# Patient Record
Sex: Female | Born: 1990 | Race: White | Hispanic: No | Marital: Single | Smoking: Never smoker
Health system: Southern US, Community
[De-identification: ages and names within clinical notes are randomized; demographics above are authoritative.]

## PROBLEM LIST (undated history)

## (undated) DIAGNOSIS — A071 Giardiasis [lambliasis]: Secondary | ICD-10-CM

## (undated) DIAGNOSIS — N920 Excessive and frequent menstruation with regular cycle: Secondary | ICD-10-CM

## (undated) DIAGNOSIS — Z9109 Other allergy status, other than to drugs and biological substances: Secondary | ICD-10-CM

## (undated) DIAGNOSIS — F419 Anxiety disorder, unspecified: Secondary | ICD-10-CM

## (undated) DIAGNOSIS — R569 Unspecified convulsions: Secondary | ICD-10-CM

## (undated) DIAGNOSIS — F32A Depression, unspecified: Secondary | ICD-10-CM

## (undated) DIAGNOSIS — F329 Major depressive disorder, single episode, unspecified: Secondary | ICD-10-CM

## (undated) HISTORY — DX: Other allergy status, other than to drugs and biological substances: Z91.09

## (undated) HISTORY — DX: Anxiety disorder, unspecified: F41.9

## (undated) HISTORY — DX: Major depressive disorder, single episode, unspecified: F32.9

## (undated) HISTORY — DX: Excessive and frequent menstruation with regular cycle: N92.0

## (undated) HISTORY — DX: Depression, unspecified: F32.A

## (undated) HISTORY — PX: FOOT SURGERY: SHX648

## (undated) HISTORY — DX: Hemochromatosis, unspecified: E83.119

## (undated) HISTORY — PX: COSMETIC SURGERY: SHX468

---
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
## 2012-07-09 ENCOUNTER — Ambulatory Visit
Admission: RE | Admit: 2012-07-09 | Discharge: 2012-07-09 | Disposition: A | Discharge status: Home / Self Care | Payer: 59 | Source: Ambulatory Visit

## 2012-07-09 ENCOUNTER — Other Ambulatory Visit: Payer: Self-pay

## 2012-07-09 DIAGNOSIS — R51 Headache: Secondary | ICD-10-CM

## 2013-05-01 ENCOUNTER — Other Ambulatory Visit: Payer: Self-pay | Admitting: Podiatry

## 2013-05-01 DIAGNOSIS — M76829 Posterior tibial tendinitis, unspecified leg: Secondary | ICD-10-CM

## 2013-05-06 ENCOUNTER — Ambulatory Visit
Admission: RE | Admit: 2013-05-06 | Discharge: 2013-05-06 | Disposition: A | Discharge status: Home / Self Care | Payer: 59 | Source: Ambulatory Visit | Attending: Podiatry | Admitting: Podiatry

## 2013-05-06 DIAGNOSIS — M76829 Posterior tibial tendinitis, unspecified leg: Secondary | ICD-10-CM

## 2013-05-21 ENCOUNTER — Encounter: Payer: Self-pay | Admitting: Podiatry

## 2013-05-21 ENCOUNTER — Ambulatory Visit (INDEPENDENT_AMBULATORY_CARE_PROVIDER_SITE_OTHER): Payer: 59 | Admitting: Podiatry

## 2013-05-21 VITALS — BP 111/79 | HR 60 | Resp 12 | Ht 71.0 in | Wt 180.0 lb

## 2013-05-21 DIAGNOSIS — M7750 Other enthesopathy of unspecified foot: Secondary | ICD-10-CM

## 2013-05-21 MED ORDER — TRIAMCINOLONE ACETONIDE 10 MG/ML IJ SUSP
5.0000 mg | Freq: Once | INTRAMUSCULAR | Status: AC
  Administered 2013-05-21: 5 mg via INTRA_ARTICULAR

## 2013-05-21 NOTE — Progress Notes (Signed)
Subjective:     Patient ID: Samantha [name], female   DOB: 09-11-1990, 22 y.o.   MRN: [id number]  Foot Pain   patient's left foot is still sore but gradually slightly better. She is with her mother today   Review of Systems  All other systems reviewed and are negative.       Objective:   Physical Exam  Nursing note and vitals reviewed. Constitutional: She appears well-developed and well-nourished.  Cardiovascular: Intact distal pulses.   Musculoskeletal: Normal range of motion.  Neurological: She is alert.  Skin: Skin is warm.   Patient's left arch and slightly proximal remains tender to palpation. The pain seems to be more distal than it where it was previous    Assessment:     Tendinitis with plantar fasciitis still noted    Plan:     Reviewed MRI with patient and mother. Explained that there was no indications of care or active process we reviewed everything we have tried up to this time and I did inject more distal 3 mg Kenalog 5 g Xylocaine Marcaine mixture and advised on continued immobilization. Reappoint her recheck as needed

## 2013-06-18 ENCOUNTER — Ambulatory Visit (INDEPENDENT_AMBULATORY_CARE_PROVIDER_SITE_OTHER): Payer: 59 | Admitting: Podiatry

## 2013-06-18 ENCOUNTER — Encounter: Payer: Self-pay | Admitting: Podiatry

## 2013-06-18 VITALS — BP 138/68 | HR 76 | Resp 12

## 2013-06-18 DIAGNOSIS — M7750 Other enthesopathy of unspecified foot: Secondary | ICD-10-CM

## 2013-06-18 NOTE — Progress Notes (Signed)
Subjective:     Patient ID: Samantha [name], female   DOB: 1991-05-17, 22 y.o.   MRN: [id number]  Foot Pain   patient states that it's feeling some better and only seems to hurt after a long day of work. Points to left arch   Review of Systems     Objective:   Physical Exam  Nursing note and vitals reviewed. Constitutional: She is oriented to person, place, and time.  Cardiovascular: Intact distal pulses.   Musculoskeletal: Normal range of motion.  Neurological: She is oriented to person, place, and time.  Skin: Skin is warm.   patient has mild discomfort when I pressed into the area but improved over where it has been previously with minimal edema noted     Assessment:     Improving tendinitis left    Plan:     Continue conservative orthotic usage boot usage at times and progressive ice. Patient discharged un less need

## 2015-02-07 ENCOUNTER — Encounter (HOSPITAL_COMMUNITY): Payer: Self-pay | Admitting: Emergency Medicine

## 2015-02-07 ENCOUNTER — Emergency Department (HOSPITAL_COMMUNITY)
Admission: EM | Admit: 2015-02-07 | Discharge: 2015-02-07 | Disposition: A | Discharge status: Home / Self Care | Payer: 59 | Attending: Emergency Medicine | Admitting: Emergency Medicine

## 2015-02-07 DIAGNOSIS — R197 Diarrhea, unspecified: Secondary | ICD-10-CM | POA: Diagnosis not present

## 2015-02-07 DIAGNOSIS — Z79899 Other long term (current) drug therapy: Secondary | ICD-10-CM | POA: Insufficient documentation

## 2015-02-07 DIAGNOSIS — Z793 Long term (current) use of hormonal contraceptives: Secondary | ICD-10-CM | POA: Diagnosis not present

## 2015-02-07 DIAGNOSIS — R112 Nausea with vomiting, unspecified: Secondary | ICD-10-CM | POA: Insufficient documentation

## 2015-02-07 DIAGNOSIS — Z3202 Encounter for pregnancy test, result negative: Secondary | ICD-10-CM | POA: Insufficient documentation

## 2015-02-07 DIAGNOSIS — Z8619 Personal history of other infectious and parasitic diseases: Secondary | ICD-10-CM | POA: Diagnosis not present

## 2015-02-07 HISTORY — DX: Giardiasis (lambliasis): A07.1

## 2015-02-07 LAB — I-STAT CHEM 8, ED
BUN: 8 mg/dL (ref 6–20)
CALCIUM ION: 1.16 mmol/L (ref 1.12–1.23)
CHLORIDE: 106 mmol/L (ref 101–111)
Creatinine, Ser: 1.1 mg/dL — ABNORMAL HIGH (ref 0.44–1.00)
GLUCOSE: 94 mg/dL (ref 65–99)
HCT: 46 % (ref 36.0–46.0)
HEMOGLOBIN: 15.6 g/dL — AB (ref 12.0–15.0)
POTASSIUM: 3.9 mmol/L (ref 3.5–5.1)
Sodium: 139 mmol/L (ref 135–145)
TCO2: 20 mmol/L (ref 0–100)

## 2015-02-07 LAB — URINALYSIS, ROUTINE W REFLEX MICROSCOPIC
Bilirubin Urine: NEGATIVE
Glucose, UA: NEGATIVE mg/dL
HGB URINE DIPSTICK: NEGATIVE
KETONES UR: NEGATIVE mg/dL
LEUKOCYTES UA: NEGATIVE
Nitrite: NEGATIVE
PH: 6.5 (ref 5.0–8.0)
PROTEIN: NEGATIVE mg/dL
SPECIFIC GRAVITY, URINE: 1.008 (ref 1.005–1.030)
Urobilinogen, UA: 0.2 mg/dL (ref 0.0–1.0)

## 2015-02-07 LAB — PREGNANCY, URINE: Preg Test, Ur: NEGATIVE

## 2015-02-07 MED ORDER — SODIUM CHLORIDE 0.9 % IV BOLUS (SEPSIS)
1000.0000 mL | Freq: Once | INTRAVENOUS | Status: AC
Start: 2015-02-07 — End: 2015-02-07
  Administered 2015-02-07: 1000 mL via INTRAVENOUS

## 2015-02-07 MED ORDER — ONDANSETRON HCL 4 MG/2ML IJ SOLN
4.0000 mg | Freq: Once | INTRAMUSCULAR | Status: AC
  Administered 2015-02-07: 4 mg via INTRAVENOUS
  Filled 2015-02-07: qty 2

## 2015-02-07 MED ORDER — ONDANSETRON HCL 4 MG PO TABS: 4.0000 mg | ORAL_TABLET | Freq: Four times a day (QID) | ORAL | Status: AC

## 2015-02-07 NOTE — ED Notes (Signed)
Bed: WA02 Expected date:  Expected time:  Means of arrival:  Comments: 

## 2015-02-07 NOTE — ED Notes (Signed)
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 Pt states she feels the same as she did previously. Pt states she is able to keep down water, but feels bloated and has epigastric discomfort.

## 2015-02-07 NOTE — ED Notes (Signed)
Pt c/o n/v/d for several days, last 24hours 4 x and diarrhea several times. Pt states she was diagnosed with Giardia a few weeks ago and was treated with antibiotics, pt states s/s similar.

## 2015-02-07 NOTE — ED Provider Notes (Signed)
CSN: [id number]     Arrival date & time 02/07/15  1943 History   First MD Initiated Contact with Patient 02/07/15 2006     Chief Complaint  Patient presents with  . Emesis     (Consider location/radiation/quality/duration/timing/severity/associated sxs/prior Treatment) HPI  Pt presenting with c/o vomiting and diarrhea.  Symptoms began yesterday, she has had about 3 episodes of emesis today.  Nonbloody and nonbilious.  One episode of watery diarrhea today, more yesterday.  She states the diarrhea is foul smelling and she states her symptoms are the same as giardia- she was treated for that in April 2016.  No recent travel, no camping.  No other contacts with similar symptoms.  There are no other associated systemic symptoms, there are no other alleviating or modifying factors.   Past Medical History  Diagnosis Date  . Giardia    Past Surgical History  Procedure Laterality Date  . Foot surgery Left    No family history on file. History  Substance Use Topics  . Smoking status: Never Smoker   . Smokeless tobacco: Not on file  . Alcohol Use: Yes     Comment: daily   OB History    No data available     Review of Systems  ROS reviewed and all otherwise negative except for mentioned in HPI    Allergies  Sulfa antibiotics  Home Medications   Prior to Admission medications   Medication Sig Start Date End Date Taking? Authorizing Provider  FLUoxetine (PROZAC) 40 MG capsule Take 1 capsule by mouth daily. 02/05/15  Yes Historical Provider, MD  levonorgestrel-ethinyl estradiol (SEASONALE,INTROVALE,JOLESSA) 0.15-0.03 MG tablet Take 1 tablet by mouth daily. 12/26/14  Yes Historical Provider, MD  ondansetron (ZOFRAN) 4 MG tablet Take 1 tablet (4 mg total) by mouth every 6 (six) hours. 02/07/15   Jerelyn Scott, MD   BP 140/85 mmHg  Pulse 84  Temp(Src) 98 F (36.7 C) (Oral)  Resp 18  Ht  (1.803 m)  Wt 209 lb 9.6 oz (95.074 kg)  BMI 29.25 kg/m2  SpO2 100%  LMP 01/17/2015   Vitals reviewed Physical Exam  Physical Examination: General appearance - alert, well appearing, and in no distress Mental status - alert, oriented to person, place, and time Eyes - no conjunctival injection, no scleral icterus Mouth - mucous membranes moist, pharynx normal without lesions Chest - clear to auscultation, no wheezes, rales or rhonchi, symmetric air entry Heart - normal rate, regular rhythm, normal S1, S2, no murmurs, rubs, clicks or gallops Abdomen - soft, nontender, nondistended, no masses or organomegaly, nabs Neurological - alert, oriented Extremities - peripheral pulses normal, no pedal edema, no clubbing or cyanosis Skin - normal coloration and turgor, no rashes  ED Course  Procedures (including critical care time) Labs Review Labs Reviewed  I-STAT CHEM 8, ED - Abnormal; Notable for the following:    Creatinine, Ser 1.10 (*)    Hemoglobin 15.6 (*)    All other components within normal limits  STOOL CULTURE  CLOSTRIDIUM DIFFICILE BY PCR (NOT AT [facility])  OVA AND PARASITE EXAMINATION  URINALYSIS, ROUTINE W REFLEX MICROSCOPIC (NOT AT [facility])  PREGNANCY, URINE    Imaging Review No results found.   EKG Interpretation None      MDM   Final diagnoses:  Nausea vomiting and diarrhea    Pt presenting with nausea vomiting and diarrhea. Symptoms began yesterday, she states she is concerned her symptoms are due to giardia, she was treated for this in April with  resolution of her symptoms.  Her vomiting is resolved after zofran, she was treated with IV fluids.  No diarrhea in the ED to obtain a stool sample, pt given specimen cup for outpatient stool eval.  Discharged with strict return precautions.  Pt agreeable with plan.    Jerelyn ScottMartha Linker, MD 02/07/15 2300

## 2015-02-07 NOTE — Discharge Instructions (Signed)
Return to the ED with any concerns including vomiting and not able to keep down liquids or your medications, abdominal pain especially if it localizes to the right lower abdomen, fever or chills, and decreased urine output, decreased level of alertness or lethargy, or any other alarming symptoms.  °

## 2015-03-26 ENCOUNTER — Other Ambulatory Visit: Payer: Self-pay | Admitting: Physician Assistant

## 2015-03-26 DIAGNOSIS — R1011 Right upper quadrant pain: Secondary | ICD-10-CM

## 2015-03-31 ENCOUNTER — Ambulatory Visit
Admission: RE | Admit: 2015-03-31 | Discharge: 2015-03-31 | Disposition: A | Discharge status: Home / Self Care | Payer: 59 | Source: Ambulatory Visit | Attending: Physician Assistant | Admitting: Physician Assistant

## 2015-03-31 DIAGNOSIS — R1011 Right upper quadrant pain: Secondary | ICD-10-CM

## 2016-05-23 ENCOUNTER — Ambulatory Visit (INDEPENDENT_AMBULATORY_CARE_PROVIDER_SITE_OTHER): Payer: 59 | Admitting: Sports Medicine

## 2016-05-23 DIAGNOSIS — M25562 Pain in left knee: Secondary | ICD-10-CM

## 2016-06-18 ENCOUNTER — Emergency Department (HOSPITAL_COMMUNITY)
Admission: EM | Admit: 2016-06-18 | Discharge: 2016-06-19 | Disposition: A | Discharge status: Home / Self Care | Payer: 59 | Attending: Emergency Medicine | Admitting: Emergency Medicine

## 2016-06-18 ENCOUNTER — Emergency Department (HOSPITAL_COMMUNITY): Payer: 59

## 2016-06-18 ENCOUNTER — Encounter (HOSPITAL_COMMUNITY): Payer: Self-pay | Admitting: Emergency Medicine

## 2016-06-18 DIAGNOSIS — Y939 Activity, unspecified: Secondary | ICD-10-CM | POA: Diagnosis not present

## 2016-06-18 DIAGNOSIS — Z79899 Other long term (current) drug therapy: Secondary | ICD-10-CM | POA: Insufficient documentation

## 2016-06-18 DIAGNOSIS — W228XXA Striking against or struck by other objects, initial encounter: Secondary | ICD-10-CM | POA: Diagnosis not present

## 2016-06-18 DIAGNOSIS — Y990 Civilian activity done for income or pay: Secondary | ICD-10-CM | POA: Insufficient documentation

## 2016-06-18 DIAGNOSIS — Y9289 Other specified places as the place of occurrence of the external cause: Secondary | ICD-10-CM | POA: Insufficient documentation

## 2016-06-18 DIAGNOSIS — R569 Unspecified convulsions: Secondary | ICD-10-CM | POA: Diagnosis not present

## 2016-06-18 DIAGNOSIS — S0990XA Unspecified injury of head, initial encounter: Secondary | ICD-10-CM | POA: Diagnosis present

## 2016-06-18 LAB — BASIC METABOLIC PANEL
Anion gap: 12 (ref 5–15)
BUN: 6 mg/dL (ref 6–20)
CO2: 21 mmol/L — ABNORMAL LOW (ref 22–32)
CREATININE: 1.03 mg/dL — AB (ref 0.44–1.00)
Calcium: 8.8 mg/dL — ABNORMAL LOW (ref 8.9–10.3)
Chloride: 100 mmol/L — ABNORMAL LOW (ref 101–111)
Glucose, Bld: 113 mg/dL — ABNORMAL HIGH (ref 65–99)
Potassium: 3.9 mmol/L (ref 3.5–5.1)
SODIUM: 133 mmol/L — AB (ref 135–145)

## 2016-06-18 LAB — CBC
HCT: 38.1 % (ref 36.0–46.0)
Hemoglobin: 12.9 g/dL (ref 12.0–15.0)
MCH: 33.9 pg (ref 26.0–34.0)
MCHC: 33.9 g/dL (ref 30.0–36.0)
MCV: 100 fL (ref 78.0–100.0)
PLATELETS: 172 10*3/uL (ref 150–400)
RBC: 3.81 MIL/uL — AB (ref 3.87–5.11)
RDW: 16.4 % — AB (ref 11.5–15.5)
WBC: 5.9 10*3/uL (ref 4.0–10.5)

## 2016-06-18 LAB — RAPID URINE DRUG SCREEN, HOSP PERFORMED
AMPHETAMINES: NOT DETECTED
BENZODIAZEPINES: NOT DETECTED
Barbiturates: NOT DETECTED
COCAINE: NOT DETECTED
OPIATES: NOT DETECTED
Tetrahydrocannabinol: NOT DETECTED

## 2016-06-18 LAB — MAGNESIUM: Magnesium: 1.4 mg/dL — ABNORMAL LOW (ref 1.7–2.4)

## 2016-06-18 LAB — POC URINE PREG, ED: Preg Test, Ur: NEGATIVE

## 2016-06-18 MED ORDER — MAGNESIUM CHLORIDE 64 MG PO TBEC: 1.0000 | DELAYED_RELEASE_TABLET | Freq: Once | ORAL | Status: DC

## 2016-06-18 MED ORDER — SODIUM CHLORIDE 0.9 % IV BOLUS (SEPSIS)
1000.0000 mL | Freq: Once | INTRAVENOUS | Status: AC
  Administered 2016-06-18: 1000 mL via INTRAVENOUS

## 2016-06-18 MED ORDER — MAGNESIUM OXIDE 400 (241.3 MG) MG PO TABS
800.0000 mg | ORAL_TABLET | Freq: Once | ORAL | Status: AC
  Administered 2016-06-18: 800 mg via ORAL
  Filled 2016-06-18: qty 2

## 2016-06-18 MED ORDER — ACETAMINOPHEN 500 MG PO TABS
1000.0000 mg | ORAL_TABLET | Freq: Once | ORAL | Status: AC
  Administered 2016-06-18: 1000 mg via ORAL
  Filled 2016-06-18: qty 2

## 2016-06-18 MED ORDER — MAGNESIUM SULFATE 2 GM/50ML IV SOLN
2.0000 g | Freq: Once | INTRAVENOUS | Status: AC
  Administered 2016-06-18: 2 g via INTRAVENOUS
  Filled 2016-06-18: qty 50

## 2016-06-18 NOTE — Discharge Instructions (Signed)
It appears you had a seizure tonight. Do not drive any motorized vehicles, ride a bike, swim, take a bath by herself, or climb a ladder until you've been cleared by the neurologist. Do not engage in any activities where a seizure could cause serious harm to you or someone else. It is very important that you follow up with the neurologist as soon as possible and 1-2 weeks.

## 2016-06-18 NOTE — ED Notes (Signed)
ED Provider at bedside. 

## 2016-06-18 NOTE — ED Notes (Signed)
Patient transported to CT 

## 2016-06-18 NOTE — ED Triage Notes (Signed)
At work and had witnessed seizure. No history of seizures. Does not remember what happened. Friend states she started shaking then fell. Hematoma to right side of head. The shaking lasted about 5 seconds, then she just stared and did not speak for about 5 minutes according to witness. Arrives A&Ox4. No urination or tongue injury. Reports headache 8/10 at site of hematoma.

## 2016-06-18 NOTE — ED Notes (Signed)
Patient returned from Radiology. 

## 2016-06-18 NOTE — ED Provider Notes (Addendum)
[facility]-EMERGENCY DEPT Provider Note   CSN: [id number] Arrival date & time: 06/18/16  2005     History   Chief Complaint Chief Complaint  Patient presents with  . Seizures    HPI Samantha [name] is a 25 y.o. female.  HPI  25 year old female presents after a possible seizure. 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 This is told to me by a friend who is there but did not see the whole thing. Probably the patient stop shaking a few moments after falling and hitting her head. After regaining consciousness she seemed confused and was just staring ahead for about 5 minutes. She then became normal. She has never had a seizure before. She now has a throbbing headache to the right parietal head. Large hematoma without bleeding. Patient states she felt "weird" and anxious for a few seconds right before this occurred. Has been having a "cold" for the past 1 week. This includes cough, congestion, runny nose, and subjective fevers. Denies any headaches prior to the fall. No weakness or numbness. Patient denies nausea or vomiting.  Past Medical History:  Diagnosis Date  . Giardia     There are no active problems to display for this patient.   Past Surgical History:  Procedure Laterality Date  . FOOT SURGERY Left     OB History    No data available       Home Medications    Prior to Admission medications   Medication Sig Start Date End Date Taking? Authorizing Provider  FLUoxetine (PROZAC) 40 MG capsule Take 1 capsule by mouth daily. 02/05/15   Historical Provider, MD  levonorgestrel-ethinyl estradiol (SEASONALE,INTROVALE,JOLESSA) 0.15-0.03 MG tablet Take 1 tablet by mouth daily. 12/26/14   Historical Provider, MD  ondansetron (ZOFRAN) 4 MG tablet Take 1 tablet (4 mg total) by mouth every 6 (six) hours. 02/07/15   Jerelyn Darlen Gledhill, MD    Family History History reviewed. No pertinent family  history.  Social History Social History  Substance Use Topics  . Smoking status: Never Smoker  . Smokeless tobacco: Never Used  . Alcohol use Yes     Comment: daily (beer or two a day)     Allergies   Sulfa antibiotics   Review of Systems Review of Systems  Constitutional: Positive for fever.  HENT: Positive for congestion and rhinorrhea.   Eyes: Negative for visual disturbance.  Respiratory: Positive for cough. Negative for shortness of breath.   Cardiovascular: Negative for chest pain.  Gastrointestinal: Negative for abdominal pain, nausea and vomiting.  Musculoskeletal: Negative for neck pain and neck stiffness.  Neurological: Positive for headaches. Negative for weakness and numbness.  All other systems reviewed and are negative.    Physical Exam Updated Vital Signs BP 143/87   Pulse 72   Temp 97.7 F (36.5 C) (Oral)   Resp 17   LMP 03/07/2016 (Approximate) Comment: On 3 month OCP pills  SpO2 100%   Physical Exam  Constitutional: She is oriented to person, place, and time. She appears well-developed and well-nourished.  HENT:  Head: Normocephalic. Head is with contusion.    Right Ear: External ear normal.  Left Ear: External ear normal.  Nose: Nose normal.  Eyes: EOM are normal. Pupils are equal, round, and reactive to light. Right eye exhibits no discharge. Left eye exhibits no discharge.  Mild photophobia  Neck: Normal range of motion. Neck supple. No spinous process tenderness  and no muscular tenderness present.  Cardiovascular: Normal rate, regular rhythm and normal heart sounds.   Pulmonary/Chest: Effort normal and breath sounds normal.  Abdominal: Soft. There is no tenderness.  Neurological: She is alert and oriented to person, place, and time.  CN 3-12 grossly intact. 5/5 strength in all 4 extremities. Grossly normal sensation. Normal finger to nose.   Skin: Skin is warm and dry.  Nursing note and vitals reviewed.    ED Treatments / Results   Labs (all labs ordered are listed, but only abnormal results are displayed) Labs Reviewed  BASIC METABOLIC PANEL - Abnormal; Notable for the following:       Result Value   Sodium 133 (*)    Chloride 100 (*)    CO2 21 (*)    Glucose, Bld 113 (*)    Creatinine, Ser 1.03 (*)    Calcium 8.8 (*)    All other components within normal limits  CBC - Abnormal; Notable for the following:    RBC 3.81 (*)    RDW 16.4 (*)    All other components within normal limits  MAGNESIUM - Abnormal; Notable for the following:    Magnesium 1.4 (*)    All other components within normal limits  RAPID URINE DRUG SCREEN, HOSP PERFORMED  MAGNESIUM  POC URINE PREG, ED    EKG  EKG Interpretation  Date/Time:  Sunday June 18 2016 20:39:43 EST Ventricular Rate:  77 PR Interval:    QRS Duration: 110 QT Interval:  406 QTC Calculation: 460 R Axis:   32 Text Interpretation:  Age not entered, assumed to be  25 years old for purpose of ECG interpretation Normal sinus rhythm Low voltage, precordial leads RSR' in V1 or V2, right VCD or RVH Baseline wander in lead(s) V5 V6 No significant change since 2007 Confirmed by Dekari Bures MD, Shakyia Bosso [phone]([id number]) on 06/18/2016 8:55:43 PM       Radiology Dg Chest 2 View  Result Date: 06/18/2016 CLINICAL DATA:  At work and had witnessed seizure. No history of seizures. Does not remember what happened. Friend states she started shaking then fell. Hematoma to right side of head. The shaking lasted about 5 seconds, EXAM: CHEST  2 VIEW COMPARISON:  06/15/2006 FINDINGS: Normal mediastinum and cardiac silhouette. Normal pulmonary vasculature. No evidence of effusion, infiltrate, or pneumothorax. No acute bony abnormality. IMPRESSION: Normal chest radiograph Electronically Signed   By: Genevive BiStewart  Edmunds M.D.   On: 06/18/2016 21:09   Ct Head Wo Contrast  Result Date: 06/18/2016 CLINICAL DATA:  Seizure and fall this evening. EXAM: CT HEAD WITHOUT CONTRAST TECHNIQUE: Contiguous axial  images were obtained from the base of the skull through the vertex without intravenous contrast. COMPARISON:  07/09/2012 FINDINGS: Brain: No evidence of acute infarction, hemorrhage, hydrocephalus, extra-axial collection or mass lesion/mass effect. Gray matter and white matter are unremarkable, with normal differentiation. Vascular: No hyperdense vessel or unexpected calcification. Skull: Normal. Negative for fracture or focal lesion. Sinuses/Orbits: No acute finding. Other: High right parietal scalp hematoma. IMPRESSION: Normal brain. Electronically Signed   By: Ellery Plunkaniel R Mitchell M.D.   On: 06/18/2016 21:08    Procedures Procedures (including critical care time)  Medications Ordered in ED Medications  magnesium sulfate IVPB 2 g 50 mL (2 g Intravenous New Bag/Given 06/18/16 2225)  sodium chloride 0.9 % bolus 1,000 mL (0 mLs Intravenous Stopped 06/18/16 2200)  acetaminophen (TYLENOL) tablet 1,000 mg (1,000 mg Oral Given 06/18/16 2044)  magnesium oxide (MAG-OX) tablet 800 mg (800 mg Oral Given  06/18/16 2252)     Initial Impression / Assessment and Plan / ED Course  I have reviewed the triage vital signs and the nursing notes.  Pertinent labs & imaging results that were available during my care of the patient were reviewed by me and considered in my medical decision making (see chart for details).  Clinical Course as of Jun 18 2309  Wynelle LinkSun Jun 18, 2016  2027 Patient appears to have had a first time seizure. Will get CT head based on seizure and large hematoma. Neuro exam normal. Labs, CXR given cough x 1 week. Overall appears well and stable.  [SG]  2145 D/w Dr. Otelia LimesLindzen, given no focal findings now, return to baseline and well appearance, can get MRI and EEG as outpatient. Asks for magnesium to be checked. If negative will d/c home  [SG]  2215 Dr. Otelia LimesLindzen recommends getting magnesium back into normal range before discharge. Will give IV, PO, and recheck.   [SG]    Clinical Course User Index [SG]  Pricilla LovelessScott Shevelle Smither, MD    [facility] Waiting on repeat mag after replacement. If back to normal, d/c. Discussed seizure precautions including no driving, etc. Given this is a first time, one time seizure, no meds but close neuro f/u  Final Clinical Impressions(s) / ED Diagnoses   Final diagnoses:  Seizure (HCC)  Hypomagnesemia    New Prescriptions New Prescriptions   No medications on file     Pricilla LovelessScott Aleshka Corney, MD 06/18/16 2311    Pricilla LovelessScott Lamondre Wesche, MD 06/18/16 2311

## 2016-06-19 LAB — MAGNESIUM: MAGNESIUM: 2.4 mg/dL (ref 1.7–2.4)

## 2016-06-19 NOTE — ED Provider Notes (Signed)
25 year old female presents to the ER with possible new onset of seizures and subsequent head contusion.  She is given to me at shift change with repeated magnesium level pending after replacement in the ER.  She was seen by Dr. Criss AlvineGoldston, and Neurology was consulted, who advised Mg replacement, repeat lab and outpt neuro followup.  Repeated magnesium improved and within normal limits. Patient discharged home in good condition with stable vital signs, outpatient follow-up discussed, return precautions reviewed, patient verbalizes understanding.  Results for orders placed or performed during the hospital encounter of 06/18/16  Basic metabolic panel - if new onset seizures  Result Value Ref Range   Sodium 133 (L) 135 - 145 mmol/L   Potassium 3.9 3.5 - 5.1 mmol/L   Chloride 100 (L) 101 - 111 mmol/L   CO2 21 (L) 22 - 32 mmol/L   Glucose, Bld 113 (H) 65 - 99 mg/dL   BUN 6 6 - 20 mg/dL   Creatinine, Ser 1.611.03 (H) 0.44 - 1.00 mg/dL   Calcium 8.8 (L) 8.9 - 10.3 mg/dL   GFR calc non Af Amer >60 >60 mL/min   GFR calc Af Amer >60 >60 mL/min   Anion gap 12 5 - 15  CBC - if new onset seizures  Result Value Ref Range   WBC 5.9 4.0 - 10.5 K/uL   RBC 3.81 (L) 3.87 - 5.11 MIL/uL   Hemoglobin 12.9 12.0 - 15.0 g/dL   HCT 09.638.1 04.536.0 - 40.946.0 %   MCV 100.0 78.0 - 100.0 fL   MCH 33.9 26.0 - 34.0 pg   MCHC 33.9 30.0 - 36.0 g/dL   RDW 81.116.4 (H) 91.411.5 - 78.215.5 %   Platelets 172 150 - 400 K/uL  Urine rapid drug screen (hosp performed)  Result Value Ref Range   Opiates NONE DETECTED NONE DETECTED   Cocaine NONE DETECTED NONE DETECTED   Benzodiazepines NONE DETECTED NONE DETECTED   Amphetamines NONE DETECTED NONE DETECTED   Tetrahydrocannabinol NONE DETECTED NONE DETECTED   Barbiturates NONE DETECTED NONE DETECTED  Magnesium  Result Value Ref Range   Magnesium 1.4 (L) 1.7 - 2.4 mg/dL  Magnesium  Result Value Ref Range   Magnesium 2.4 1.7 - 2.4 mg/dL  POC urine preg, ED  Result Value Ref Range   Preg Test, Ur  NEGATIVE NEGATIVE   Dg Chest 2 View  Result Date: 06/18/2016 CLINICAL DATA:  At work and had witnessed seizure. No history of seizures. Does not remember what happened. Friend states she started shaking then fell. Hematoma to right side of head. The shaking lasted about 5 seconds, EXAM: CHEST  2 VIEW COMPARISON:  06/15/2006 FINDINGS: Normal mediastinum and cardiac silhouette. Normal pulmonary vasculature. No evidence of effusion, infiltrate, or pneumothorax. No acute bony abnormality. IMPRESSION: Normal chest radiograph Electronically Signed   By: Genevive BiStewart  Edmunds M.D.   On: 06/18/2016 21:09   Ct Head Wo Contrast  Result Date: 06/18/2016 CLINICAL DATA:  Seizure and fall this evening. EXAM: CT HEAD WITHOUT CONTRAST TECHNIQUE: Contiguous axial images were obtained from the base of the skull through the vertex without intravenous contrast. COMPARISON:  07/09/2012 FINDINGS: Brain: No evidence of acute infarction, hemorrhage, hydrocephalus, extra-axial collection or mass lesion/mass effect. Gray matter and white matter are unremarkable, with normal differentiation. Vascular: No hyperdense vessel or unexpected calcification. Skull: Normal. Negative for fracture or focal lesion. Sinuses/Orbits: No acute finding. Other: High right parietal scalp hematoma. IMPRESSION: Normal brain. Electronically Signed   By: Ellery Plunkaniel R Mitchell M.D.   On:  06/18/2016 21:08     Vitals:   06/19/16 0000 06/19/16 0030  BP: 140/87 137/90  Pulse: 72 72  Resp: 21 19  Temp:     Discharged per Dr. Alric RanGoldston's instructions.    Danelle BerryLeisa Halen Antenucci, PA-C 06/19/16 [id number]    Pricilla LovelessScott Goldston, MD 06/19/16 1434

## 2016-06-23 ENCOUNTER — Other Ambulatory Visit: Payer: Self-pay | Admitting: Family Medicine

## 2016-06-23 DIAGNOSIS — R569 Unspecified convulsions: Secondary | ICD-10-CM

## 2016-07-03 ENCOUNTER — Ambulatory Visit (INDEPENDENT_AMBULATORY_CARE_PROVIDER_SITE_OTHER): Payer: 59 | Admitting: Neurology

## 2016-07-03 ENCOUNTER — Encounter: Payer: Self-pay | Admitting: Neurology

## 2016-07-03 DIAGNOSIS — R569 Unspecified convulsions: Secondary | ICD-10-CM

## 2016-07-03 NOTE — Progress Notes (Signed)
PATIENT: Samantha [name] DOB: 11/13/1990  Chief Complaint  Patient presents with  . Seizures    She is here with her boyfriend, Jeannett SeniorStephen.  Reports having her first and only seizure on 06/19/16, while working at [employer].  States while handing a customer an order, it was reported that her hand started shaking, she fell to the ground and then her whole body started to tremor.  The event lasted approximately three mintues.  She was taken to the ED for evaluation.  She had a normal CT scan but did not have an EEG.  She was not placed on medication.     HISTORICAL  Samantha [name] is 25 years old right-handed female accompanied by her boyfriend Jeannett SeniorStephen, seen in refer by her primary care physician Dr.  Farris HasAaron Morrow for evaluation of seizure, initial evaluation was on July 03 2016.  I reviewed and summarized the referring note, she had a history of anxiety, hemochromatosis, abnormal liver functional tests, depression, is taking fluoxetine 60 mg daily, clonazepam 0.5 milligrams 1 tablets as needed, also contraceptives,  She denied a family history of previous history of seizure, [facility] room on June 18 2016 after having seizure at her workplace, she works as a Physiological scientistwaitress and bartender, while serving a patient around 7 PM, handing out the food to the patient, she suddenly fell backwards with raising right hand, then went into tonic-clonic seizure lasting for a few minutes, followed by post event confusion.  I personally reviewed CT head without contrast that was normal Laboratory evaluation showed normal magnesium, UDS was negative, CBC was normal, with hemoglobin of 12.9, BMP showed low sodium 133, glucose was mildly elevated 113, creatinine 1.03,  REVIEW OF SYSTEMS: Full 14 system review of systems performed and notable only for easy bruising, headache, seizure, depression anxiety, allergy   ALLERGIES: Allergies  Allergen Reactions  . Sulfa Antibiotics Hives and  Rash    HOME MEDICATIONS: Current Outpatient Prescriptions  Medication Sig Dispense Refill  . clonazePAM (KLONOPIN) 0.5 MG tablet Take 0.5 mg by mouth as needed for anxiety.    Marland Kitchen. FLUoxetine (PROZAC) 40 MG capsule Take 1 capsule by mouth daily.    Marland Kitchen. ibuprofen (ADVIL,MOTRIN) 200 MG tablet Take 600 mg by mouth every 6 (six) hours as needed for moderate pain.    Marland Kitchen. levonorgestrel-ethinyl estradiol (SEASONALE,INTROVALE,JOLESSA) 0.15-0.03 MG tablet Take 1 tablet by mouth daily.  4  . omeprazole (PRILOSEC) 40 MG capsule Take 40 mg by mouth daily.    . ondansetron (ZOFRAN) 4 MG tablet Take 1 tablet (4 mg total) by mouth every 6 (six) hours. (Patient taking differently: Take 4 mg by mouth every 8 (eight) hours as needed for nausea or vomiting. ) 12 tablet 0   No current facility-administered medications for this visit.     PAST MEDICAL HISTORY: Past Medical History:  Diagnosis Date  . Anxiety   . Depression   . Environmental allergies   . Giardia   . Hemochromatosis   . Menorrhagia     PAST SURGICAL HISTORY: Past Surgical History:  Procedure Laterality Date  . COSMETIC SURGERY     Removal of birthmark on face.  Marland Kitchen. FOOT SURGERY Left     FAMILY HISTORY: Family History  Problem Relation Age of Onset  . Anxiety disorder Mother   . Depression Mother   . Asthma Father   . Psoriasis Father   . Hyperlipidemia Father   . Gout Father     SOCIAL HISTORY:  Social History   Social History  . Marital status: Single    Spouse name: N/A  . Number of children: 0  . Years of education: Bachelors   Occupational History  . Customer Service    Social History Main Topics  . Smoking status: Never Smoker  . Smokeless tobacco: Never Used  . Alcohol use Yes     Comment: daily (beer or two a day)  . Drug use:     Types: Marijuana     Comment: Occasional use.  Marland Kitchen Sexual activity: Not on file   Other Topics Concern  . Not on file   Social History Narrative   Lives at home with  boyfriend.   Right-handed.   Occasional caffeine use.     PHYSICAL EXAM   Vitals:   07/03/16 1006  BP: 126/86  Pulse: 84  Weight: 198 lb (89.8 kg)  Height: 5\' 11"  (1.803 m)    Not recorded      Body mass index is 27.62 kg/m.  PHYSICAL EXAMNIATION:  Gen: NAD, conversant, well nourised, obese, well groomed                     Cardiovascular: Regular rate rhythm, no peripheral edema, warm, nontender. Eyes: Conjunctivae clear without exudates or hemorrhage Neck: Supple, no carotid bruits. Pulmonary: Clear to auscultation bilaterally   NEUROLOGICAL EXAM:  MENTAL STATUS: Speech:    Speech is normal; fluent and spontaneous with normal comprehension.  Cognition:     Orientation to time, place and person     Normal recent and remote memory     Normal Attention span and concentration     Normal Language, naming, repeating,spontaneous speech     Fund of knowledge   CRANIAL NERVES: CN II: Visual fields are full to confrontation. Fundoscopic exam is normal with sharp discs and no vascular changes. Pupils are round equal and briskly reactive to light. CN III, IV, VI: extraocular movement are normal. No ptosis. CN V: Facial sensation is intact to pinprick in all 3 divisions bilaterally. Corneal responses are intact.  CN VII: Face is symmetric with normal eye closure and smile. CN VIII: Hearing is normal to rubbing fingers CN IX, X: Palate elevates symmetrically. Phonation is normal. CN XI: Head turning and shoulder shrug are intact CN XII: Tongue is midline with normal movements and no atrophy.  MOTOR: There is no pronator drift of out-stretched arms. Muscle bulk and tone are normal. Muscle strength is normal.  REFLEXES: Reflexes are 2+ and symmetric at the biceps, triceps, knees, and ankles. Plantar responses are flexor.  SENSORY: Intact to light touch, pinprick, positional sensation and vibratory sensation are intact in fingers and toes.  COORDINATION: Rapid  alternating movements and fine finger movements are intact. There is no dysmetria on finger-to-nose and heel-knee-shin.    GAIT/STANCE: Posture is normal. Gait is steady with normal steps, base, arm swing, and turning. Heel and toe walking are normal. Tandem gait is normal.  Romberg is absent.   DIAGNOSTIC DATA (LABS, IMAGING, TESTING) - I reviewed patient records, labs, notes, testing and imaging myself where available.   ASSESSMENT AND PLAN  Bailee Metter is a 25 y.o. female   Seizure on November 12th 2017  Complete evaluation with MRI of the brain with and without contrast  EEG  No driving until seizure free for 6 months  Return to clinic for reevaluation in 6 months   Levert Feinstein, M.D. Ph.D.  [facility] [address], [address] [place], [place] [address] Ph: [phone] Fax: [phone]  CC: Farris HasAaron Morrow, MD

## 2016-07-03 NOTE — Patient Instructions (Signed)
No driving until seizure-free for 6 months 

## 2016-07-07 ENCOUNTER — Other Ambulatory Visit: Payer: Self-pay | Admitting: Orthopedic Surgery

## 2016-07-07 DIAGNOSIS — M25562 Pain in left knee: Secondary | ICD-10-CM

## 2016-07-09 ENCOUNTER — Ambulatory Visit
Admission: RE | Admit: 2016-07-09 | Discharge: 2016-07-09 | Disposition: A | Discharge status: Home / Self Care | Payer: 59 | Source: Ambulatory Visit | Attending: Neurology | Admitting: Neurology

## 2016-07-09 DIAGNOSIS — R569 Unspecified convulsions: Secondary | ICD-10-CM

## 2016-07-09 MED ORDER — GADOBENATE DIMEGLUMINE 529 MG/ML IV SOLN
18.0000 mL | Freq: Once | INTRAVENOUS | Status: AC | PRN
  Administered 2016-07-09: 18 mL via INTRAVENOUS

## 2016-07-11 ENCOUNTER — Telehealth: Payer: Self-pay | Admitting: Neurology

## 2016-07-11 NOTE — Telephone Encounter (Signed)
Spoke to patient - aware of results and will keep follow up for further discussion. 

## 2016-07-11 NOTE — Telephone Encounter (Signed)
Please call patient, MRI brain showed mild suprtentorium T2/Flair white matter changes, most c/w microvascular abnormality, no acute change.   IMPRESSION:  This MRI of the brain with and without contrast shows the following: 1.    Multiple T2/FLAIR hyperintense foci in the subcortical and deep white matter. The changes are nonspecific and could represent microvascular ischemic changes (unexpected at this age) or be the sequela of prior inflammation or infection.  Demyelination is less likely.   None of the foci appears to be acute.  2.   Chronic maxillary, sphenoid and ethmoid sinusitis. 3.    There are no acute findings.

## 2016-07-11 NOTE — Telephone Encounter (Signed)
Left message for a return call

## 2016-07-12 ENCOUNTER — Ambulatory Visit (INDEPENDENT_AMBULATORY_CARE_PROVIDER_SITE_OTHER): Payer: 59 | Admitting: Neurology

## 2016-07-12 DIAGNOSIS — R569 Unspecified convulsions: Secondary | ICD-10-CM

## 2016-07-13 NOTE — Procedures (Signed)
   HISTORY: 25 years old female, suffered her first seizure on June 18 2016  TECHNIQUE:  16 channel EEG was performed based on standard 10-16 international system. One channel was dedicated to EKG, which has demonstrates normal sinus rhythm of 72 beats per minutes.  Upon awakening, the posterior background activity was well-developed, in alpha range, 10 Hz ,reactive to eye opening and closure.  There was no evidence of epileptiform discharge.  Photic stimulation was performed, which induced a symmetric photic driving.  Hyperventilation was performed, there was no abnormality elicit.  No sleep was achieved.  CONCLUSION: This is a  normal awake EEG.  There is no electrodiagnostic evidence of epileptiform discharge.  Levert FeinsteinYijun Jhace Fennell, M.D. Ph.D.  [facility] [address] [place], [place] [address] Phone: [phone] Fax:      [phone]

## 2016-08-25 ENCOUNTER — Encounter: Payer: Self-pay | Admitting: Oncology

## 2016-08-25 ENCOUNTER — Telehealth: Payer: Self-pay | Admitting: Oncology

## 2016-08-25 NOTE — Telephone Encounter (Signed)
Pt returned call to schedul an appt. Appt has been made for the pt to see Dr. Janyth ContesZhou on 1/30 at 1pm. Demographics verified. Pt agreed to the appt date and time.

## 2016-08-31 ENCOUNTER — Telehealth: Payer: Self-pay | Admitting: Hematology and Oncology

## 2016-08-31 ENCOUNTER — Telehealth: Payer: Self-pay | Admitting: Oncology

## 2016-08-31 NOTE — Telephone Encounter (Signed)
Pt has been rescheduled to see Dr. Pamelia HoitGudena on 2/5 at 1pm. Pt agreed to the appt date and time

## 2016-08-31 NOTE — Telephone Encounter (Signed)
Left a vm to reschedule appt. °

## 2016-09-05 ENCOUNTER — Ambulatory Visit: Payer: BLUE CROSS/BLUE SHIELD

## 2016-09-11 ENCOUNTER — Ambulatory Visit (HOSPITAL_BASED_OUTPATIENT_CLINIC_OR_DEPARTMENT_OTHER): Payer: BLUE CROSS/BLUE SHIELD | Admitting: Hematology and Oncology

## 2016-09-11 ENCOUNTER — Ambulatory Visit (HOSPITAL_BASED_OUTPATIENT_CLINIC_OR_DEPARTMENT_OTHER): Payer: BLUE CROSS/BLUE SHIELD

## 2016-09-11 ENCOUNTER — Encounter: Payer: Self-pay | Admitting: Hematology and Oncology

## 2016-09-11 NOTE — Progress Notes (Signed)
[facility] CONSULT NOTE  Patient Care Team: Farris Has, MD as PCP - General (Family Medicine)  CHIEF COMPLAINTS/PURPOSE OF CONSULTATION:  Heriditary hemachromatosis  HISTORY OF PRESENTING ILLNESS:  Samantha [name] 26 y.o. female is here because of recent diagnosis of hereditary hemochromatosis. About a year ago patient went to see her gastroenterologist for a stomach paracytic infection. She had Giardia. During that time workup revealed elevated liver function tests. She then underwent iron studies which showed elevation of iron and ferritin. She subsequently undergone genetic testing which apparently showed that she has hemochromatosis. I do not have the lab work that supports this. Subsequently she was observed every 2 or 3 months with blood work. Most recent blood work revealed elevation of AST as well as increasing ferritin and iron saturation. She was sent to [place] for evaluation regarding phlebotomy. She also had an episode of syncope which she thinks she had seizures. She had a brain MRI and follows with neurology.  I reviewed her records extensively and collaborated the history with the patient.  MEDICAL HISTORY:  Past Medical History:  Diagnosis Date  . Anxiety   . Depression   . Environmental allergies   . Giardia   . Hemochromatosis   . Menorrhagia     SURGICAL HISTORY: Past Surgical History:  Procedure Laterality Date  . COSMETIC SURGERY     Removal of birthmark on face.  Marland Kitchen FOOT SURGERY Left     SOCIAL HISTORY: Social History   Social History  . Marital status: Single    Spouse name: N/A  . Number of children: 0  . Years of education: Bachelors   Occupational History  . Customer Service    Social History Main Topics  . Smoking status: Never Smoker  . Smokeless tobacco: Never Used  . Alcohol use Yes     Comment: daily (beer or two a day)  . Drug use: Yes    Types: Marijuana     Comment: Occasional use.  Marland Kitchen Sexual activity: Not on file   Other  Topics Concern  . Not on file   Social History Narrative   Lives at home with boyfriend.   Right-handed.   Occasional caffeine use.    FAMILY HISTORY: Family History  Problem Relation Age of Onset  . Anxiety disorder Mother   . Depression Mother   . Asthma Father   . Psoriasis Father   . Hyperlipidemia Father   . Gout Father     ALLERGIES:  is allergic to sulfa antibiotics.  MEDICATIONS:  Current Outpatient Prescriptions  Medication Sig Dispense Refill  . clonazePAM (KLONOPIN) 0.5 MG tablet Take 0.5 mg by mouth as needed for anxiety.    Marland Kitchen FLUoxetine (PROZAC) 40 MG capsule Take 1 capsule by mouth daily.    Marland Kitchen ibuprofen (ADVIL,MOTRIN) 200 MG tablet Take 600 mg by mouth every 6 (six) hours as needed for moderate pain.    Marland Kitchen levonorgestrel-ethinyl estradiol (SEASONALE,INTROVALE,JOLESSA) 0.15-0.03 MG tablet Take 1 tablet by mouth daily.  4  . omeprazole (PRILOSEC) 40 MG capsule Take 40 mg by mouth daily.    . ondansetron (ZOFRAN) 4 MG tablet Take 1 tablet (4 mg total) by mouth every 6 (six) hours. (Patient taking differently: Take 4 mg by mouth every 8 (eight) hours as needed for nausea or vomiting. ) 12 tablet 0   No current facility-administered medications for this visit.     REVIEW OF SYSTEMS:   Constitutional: Denies fevers, chills or abnormal night sweats Eyes: Denies blurriness  of vision, double vision or watery eyes Ears, nose, mouth, throat, and face: Denies mucositis or sore throat Respiratory: Denies cough, dyspnea or wheezes Cardiovascular: Denies palpitation, chest discomfort or lower extremity swelling Gastrointestinal:  Denies nausea, heartburn or change in bowel habits Skin: Denies abnormal skin rashes Lymphatics: Denies new lymphadenopathy or easy bruising Neurological:Denies numbness, tingling or new weaknesses Behavioral/Psych: Mood is stable, no new changes   All other systems were reviewed with the patient and are negative.  PHYSICAL EXAMINATION: ECOG  PERFORMANCE STATUS: 1 - Symptomatic but completely ambulatory  Vitals:   09/11/16 1251  BP: 140/81  Pulse: 66  Resp: 18  Temp: 97.5 F (36.4 C)   Filed Weights   09/11/16 1251  Weight: 193 lb 11.2 oz (87.9 kg)    GENERAL:alert, no distress and comfortable SKIN: skin color, texture, turgor are normal, no rashes or significant lesions EYES: normal, conjunctiva are pink and non-injected, sclera clear OROPHARYNX:no exudate, no erythema and lips, buccal mucosa, and tongue normal  NECK: supple, thyroid normal size, non-tender, without nodularity LYMPH:  no palpable lymphadenopathy in the cervical, axillary or inguinal LUNGS: clear to auscultation and percussion with normal breathing effort HEART: regular rate & rhythm and no murmurs and no lower extremity edema ABDOMEN:abdomen soft, non-tender and normal bowel sounds Musculoskeletal:no cyanosis of digits and no clubbing  PSYCH: alert & oriented x 3 with fluent speech NEURO: no focal motor/sensory deficits  LABORATORY DATA:  I have reviewed the data as listed Lab Results  Component Value Date   WBC 5.9 06/18/2016   HGB 12.9 06/18/2016   HCT 38.1 06/18/2016   MCV 100.0 06/18/2016   PLT 172 06/18/2016   Lab Results  Component Value Date   NA 133 (L) 06/18/2016   K 3.9 06/18/2016   CL 100 (L) 06/18/2016   CO2 21 (L) 06/18/2016    RADIOGRAPHIC STUDIES: I have personally reviewed the radiological reports and agreed with the findings in the report.  ASSESSMENT AND PLAN:  Hereditary hemachromatosis: Patient has symptoms related to this including recent episodes of seizure as well as elevation of liver enzymes. I reviewed her blood work which showed that she had a ferritin of 243. Iron saturation was 99%. Because of these symptoms and blood work changes, I recommended that she undergo phlebotomy. We will start with phlebotomy weekly 4 and then decide how often she needs it.   Goal of phlebotomies to reduce ferritin below 100  and iron saturation below 50%.  Pathophysiology: I discussed the patient extensively about the pathophysiology of iron absorption and Iron overload. We discussed the mechanism of hemachromatosis leads to excessive iron absorption. We also discussed the sequelae off excessive iron deposition and tissues with regards to liver causing cirrhosis, HCC; depositing into pancreas causing diabetes. Into the skin causing bronze discoloration.  Patient is interested in starting the treatment today. We were able to fit her into our schedule to undergo phlebotomy today. Return to clinic in 4 weeks to determine the frequency of phlebotomies.  All questions were answered. The patient knows to call the clinic with any problems, questions or concerns.    Sabas SousGudena, Hafsa Lohn K, MD 09/11/16

## 2016-09-11 NOTE — Progress Notes (Signed)
Patient received first phlebotomy today.  Tolerated treatment well with no complaints.  18" gauge needle used because of small veins.  Post vital signs stable.  Discharged home with no complaints.

## 2016-09-11 NOTE — Patient Instructions (Signed)
     Therapeutic Phlebotomy, Care After Refer to this sheet in the next few weeks. These instructions provide you with information about caring for yourself after your procedure. Your health care provider may also give you more specific instructions. Your treatment has been planned according to current medical practices, but problems sometimes occur. Call your health care provider if you have any problems or questions after your procedure. What can I expect after the procedure? After the procedure, it is common to have:  Light-headedness or dizziness. You may feel faint.  Nausea.  Tiredness. Follow these instructions at home: Activity  Return to your normal activities as directed by your health care provider. Most people can go back to their normal activities right away.  Avoid strenuous physical activity and heavy lifting or pulling for about 5 hours after the procedure. Do not lift anything that is heavier than 10 lb (4.5 kg).  Athletes should avoid strenuous exercise for at least 12 hours.  Change positions slowly for the remainder of the day. This will help to prevent light-headedness or fainting.  If you feel light-headed, lie down until the feeling goes away. Eating and drinking  Be sure to eat well-balanced meals for the next 24 hours.  Drink enough fluid to keep your urine clear or pale yellow.  Avoid drinking alcohol on the day that you had the procedure. Care of the Needle Insertion Site  Keep your bandage dry. You can remove the bandage after about 5 hours or as directed by your health care provider.  If you have bleeding from the needle insertion site, elevate your arm and press firmly on the site until the bleeding stops.  If you have bruising at the site, apply ice to the area:  Put ice in a plastic bag.  Place a towel between your skin and the bag.  Leave the ice on for 20 minutes, 2-3 times a day for the first 24 hours.  If the swelling does not go away  after 24 hours, apply a warm, moist washcloth to the area for 20 minutes, 2-3 times a day. General instructions  Avoid smoking for at least 30 minutes after the procedure.  Keep all follow-up visits as directed by your health care provider. It is important to continue with further therapeutic phlebotomy treatments as directed. Contact a health care provider if:  You have redness, swelling, or pain at the needle insertion site.  You have fluid, blood, or pus coming from the needle insertion site.  You feel light-headed, dizzy, or nauseated, and the feeling does not go away.  You notice new bruising at the needle insertion site.  You feel weaker than normal.  You have a fever or chills. Get help right away if:  You have severe nausea or vomiting.  You have chest pain.  You have trouble breathing. This information is not intended to replace advice given to you by your health care provider. Make sure you discuss any questions you have with your health care provider. Document Released: 12/26/2010 Document Revised: 03/25/2016 Document Reviewed: 07/20/2014 Elsevier Interactive Patient Education  2017 [employer]  

## 2016-09-18 ENCOUNTER — Ambulatory Visit (HOSPITAL_BASED_OUTPATIENT_CLINIC_OR_DEPARTMENT_OTHER): Payer: BLUE CROSS/BLUE SHIELD

## 2016-09-18 VITALS — BP 143/89 | HR 68 | Temp 98.3°F | Resp 18

## 2016-09-18 DIAGNOSIS — R197 Diarrhea, unspecified: Secondary | ICD-10-CM

## 2016-09-18 DIAGNOSIS — R1111 Vomiting without nausea: Secondary | ICD-10-CM

## 2016-09-18 DIAGNOSIS — E860 Dehydration: Secondary | ICD-10-CM

## 2016-09-18 MED ORDER — SODIUM CHLORIDE 0.9 % IV SOLN
Freq: Once | INTRAVENOUS | Status: AC
  Administered 2016-09-18: 10:00:00 via INTRAVENOUS

## 2016-09-18 NOTE — Patient Instructions (Signed)
Dehydration, Adult Dehydration is when there is not enough fluid or water in your body. This happens when you lose more fluids than you take in. Dehydration can range from mild to very bad. It should be treated right away to keep it from getting very bad. Symptoms of mild dehydration may include:  Thirst.  Dry lips.  Slightly dry mouth.  Dry, warm skin.  Dizziness. Symptoms of moderate dehydration may include:  Very dry mouth.  Muscle cramps.  Dark pee (urine). Pee may be the color of tea.  Your body making less pee.  Your eyes making fewer tears.  Heartbeat that is uneven or faster than normal (palpitations).  Headache.  Light-headedness, especially when you stand up from sitting.  Fainting (syncope). Symptoms of very bad dehydration may include:  Changes in skin, such as:  Cold and clammy skin.  Blotchy (mottled) or pale skin.  Skin that does not quickly return to normal after being lightly pinched and let go (poor skin turgor).  Changes in body fluids, such as:  Feeling very thirsty.  Your eyes making fewer tears.  Not sweating when body temperature is high, such as in hot weather.  Your body making very little pee.  Changes in vital signs, such as:  Weak pulse.  Pulse that is more than 100 beats a minute when you are sitting still.  Fast breathing.  Low blood pressure.  Other changes, such as:  Sunken eyes.  Cold hands and feet.  Confusion.  Lack of energy (lethargy).  Trouble waking up from sleep.  Short-term weight loss.  Unconsciousness. Follow these instructions at home:  If told by your doctor, drink an ORS:  Make an ORS by using instructions on the package.  Start by drinking small amounts, about  cup (120 mL) every 5-10 minutes.  Slowly drink more until you have had the amount that your doctor said to have.  Drink enough clear fluid to keep your pee clear or pale yellow. If you were told to drink an ORS, finish the ORS  first, then start slowly drinking clear fluids. Drink fluids such as:  Water. Do not drink only water by itself. Doing that can make the salt (sodium) level in your body get too low (hyponatremia).  Ice chips.  Fruit juice that you have added water to (diluted).  Low-calorie sports drinks.  Avoid:  Alcohol.  Drinks that have a lot of sugar. These include high-calorie sports drinks, fruit juice that does not have water added, and soda.  Caffeine.  Foods that are greasy or have a lot of fat or sugar.  Take over-the-counter and prescription medicines only as told by your doctor.  Do not take salt tablets. Doing that can make the salt level in your body get too high (hypernatremia).  Eat foods that have minerals (electrolytes). Examples include bananas, oranges, potatoes, tomatoes, and spinach.  Keep all follow-up visits as told by your doctor. This is important. Contact a doctor if:  You have belly (abdominal) pain that:  Gets worse.  Stays in one area (localizes).  You have a rash.  You have a stiff neck.  You get angry or annoyed more easily than normal (irritability).  You are more sleepy than normal.  You have a harder time waking up than normal.  You feel:  Weak.  Dizzy.  Very thirsty.  You have peed (urinated) only a small amount of very dark pee during 6-8 hours. Get help right away if:  You have symptoms of  very bad dehydration.  You cannot drink fluids without throwing up (vomiting).  Your symptoms get worse with treatment.  You have a fever.  You have a very bad headache.  You are throwing up or having watery poop (diarrhea) and it:  Gets worse.  Does not go away.  You have blood or something green (bile) in your throw-up.  You have blood in your poop (stool). This may cause poop to look black and tarry.  You have not peed in 6-8 hours.  You pass out (faint).  Your heart rate when you are sitting still is more than 100 beats a  minute.  You have trouble breathing. This information is not intended to replace advice given to you by your health care provider. Make sure you discuss any questions you have with your health care provider. Document Released: 05/20/2009 Document Revised: 02/11/2016 Document Reviewed: 09/17/2015 Elsevier Interactive Patient Education  2017 [employer]  Therapeutic Phlebotomy, Care After Refer to this sheet in the next few weeks. These instructions provide you with information about caring for yourself after your procedure. Your health care provider may also give you more specific instructions. Your treatment has been planned according to current medical practices, but problems sometimes occur. Call your health care provider if you have any problems or questions after your procedure. What can I expect after the procedure? After the procedure, it is common to have:  Light-headedness or dizziness. You may feel faint.  Nausea.  Tiredness. Follow these instructions at home: Activity  Return to your normal activities as directed by your health care provider. Most people can go back to their normal activities right away.  Avoid strenuous physical activity and heavy lifting or pulling for about 5 hours after the procedure. Do not lift anything that is heavier than 10 lb (4.5 kg).  Athletes should avoid strenuous exercise for at least 12 hours.  Change positions slowly for the remainder of the day. This will help to prevent light-headedness or fainting.  If you feel light-headed, lie down until the feeling goes away. Eating and drinking  Be sure to eat well-balanced meals for the next 24 hours.  Drink enough fluid to keep your urine clear or pale yellow.  Avoid drinking alcohol on the day that you had the procedure. Care of the Needle Insertion Site  Keep your bandage dry. You can remove the bandage after about 5 hours or as directed by your health care provider.  If you have  bleeding from the needle insertion site, elevate your arm and press firmly on the site until the bleeding stops.  If you have bruising at the site, apply ice to the area:  Put ice in a plastic bag.  Place a towel between your skin and the bag.  Leave the ice on for 20 minutes, 2-3 times a day for the first 24 hours.  If the swelling does not go away after 24 hours, apply a warm, moist washcloth to the area for 20 minutes, 2-3 times a day. General instructions  Avoid smoking for at least 30 minutes after the procedure.  Keep all follow-up visits as directed by your health care provider. It is important to continue with further therapeutic phlebotomy treatments as directed. Contact a health care provider if:  You have redness, swelling, or pain at the needle insertion site.  You have fluid, blood, or pus coming from the needle insertion site.  You feel light-headed, dizzy, or nauseated, and the feeling does not go away.  You notice new bruising at the needle insertion site.  You feel weaker than normal.  You have a fever or chills. Get help right away if:  You have severe nausea or vomiting.  You have chest pain.  You have trouble breathing. This information is not intended to replace advice given to you by your health care provider. Make sure you discuss any questions you have with your health care provider. Document Released: 12/26/2010 Document Revised: 03/25/2016 Document Reviewed: 07/20/2014 Elsevier Interactive Patient Education  2017 [employer]

## 2016-09-18 NOTE — Progress Notes (Signed)
Patient reports complaining of feeling "queasy, weak and tired". Reports diarrhea and vomiting. 10 episodes of diarrhea yesterday and 2 today. 2 episodes of vomiting between yesterday and today. Consulted MD and instructed to give 500cc fluid over 1hour, and then to proceed with phlebotomy.   Blane OharaMelia Burris, BSN, RN 09/18/2016 10:15 AM    Patient reports feeling "better", only tired, after IV fluids. She wants to proceed with phlebotomy.

## 2016-09-18 NOTE — Progress Notes (Signed)
Phlebotomy started @ 1135. Used 20g IV access in RAC. Phlebotomized 500 grams in 20mins. Patient tolerated well. Snack and drink provided.   Blane OharaMelia Burris, BSN, RN 09/18/2016 11:55 AM

## 2016-09-25 ENCOUNTER — Ambulatory Visit (HOSPITAL_BASED_OUTPATIENT_CLINIC_OR_DEPARTMENT_OTHER): Payer: BLUE CROSS/BLUE SHIELD

## 2016-09-25 NOTE — Patient Instructions (Signed)
Therapeutic Phlebotomy Therapeutic phlebotomy is the controlled removal of blood from a person's body for the purpose of treating a medical condition. The procedure is similar to donating blood. Usually, about a pint (470 mL, or 0.47L) of blood is removed. The average adult has 9-12 pints (4.3-5.7 L) of blood. Therapeutic phlebotomy may be used to treat the following medical conditions:  Hemochromatosis. This is a condition in which the blood contains too much iron.  Polycythemia vera. This is a condition in which the blood contains too many red blood cells.  Porphyria cutanea tarda. This is a disease in which an important part of hemoglobin is not made properly. It results in the buildup of abnormal amounts of porphyrins in the body.  Sickle cell disease. This is a condition in which the red blood cells form an abnormal crescent shape rather than a round shape. Tell a health care provider about:  Any allergies you have.  All medicines you are taking, including vitamins, herbs, eye drops, creams, and over-the-counter medicines.  Any problems you or family members have had with anesthetic medicines.  Any blood disorders you have.  Any surgeries you have had.  Any medical conditions you have. What are the risks? Generally, this is a safe procedure. However, problems may occur, including:  Nausea or light-headedness.  Low blood pressure.  Soreness, bleeding, swelling, or bruising at the needle insertion site.  Infection. What happens before the procedure?  Follow instructions from your health care provider about eating or drinking restrictions.  Ask your health care provider about changing or stopping your regular medicines. This is especially important if you are taking diabetes medicines or blood thinners.  Wear clothing with sleeves that can be raised above the elbow.  Plan to have someone take you home after the procedure.  You may have a blood sample taken. What happens  during the procedure?  A needle will be inserted into one of your veins.  Tubing and a collection bag will be attached to that needle.  Blood will flow through the needle and tubing into the collection bag.  You may be asked to open and close your hand slowly and continually during the entire collection.  After the specified amount of blood has been removed from your body, the collection bag and tubing will be clamped.  The needle will be removed from your vein.  Pressure will be held on the site of the needle insertion to stop the bleeding.  A bandage (dressing) will be placed over the needle insertion site. The procedure may vary among health care providers and hospitals. What happens after the procedure?  Your recovery will be assessed and monitored.  You can return to your normal activities as directed by your health care provider. This information is not intended to replace advice given to you by your health care provider. Make sure you discuss any questions you have with your health care provider. Document Released: 12/26/2010 Document Revised: 03/25/2016 Document Reviewed: 07/20/2014 Elsevier Interactive Patient Education  2017 [employer] Inc.  

## 2016-10-02 ENCOUNTER — Ambulatory Visit (HOSPITAL_BASED_OUTPATIENT_CLINIC_OR_DEPARTMENT_OTHER): Payer: BLUE CROSS/BLUE SHIELD

## 2016-10-02 ENCOUNTER — Telehealth: Payer: Self-pay | Admitting: Hematology and Oncology

## 2016-10-02 NOTE — Telephone Encounter (Signed)
Per Samantha GillsAlexis, add to schedule for 10/03/16 as per Unable to access vein on 10/02/16. Patient refused a copy of the appointment schedule and is aware of tomorrow's appointment. 10/02/16

## 2016-10-02 NOTE — Progress Notes (Signed)
Attempted phlebotomy x 2 (16g phlebotomy kit to RAC/18g PIV catheter to LAC) without success. Patient requested no additional attempts today. Will return tomorrow to complete treatment.

## 2016-10-03 ENCOUNTER — Ambulatory Visit (HOSPITAL_BASED_OUTPATIENT_CLINIC_OR_DEPARTMENT_OTHER): Payer: BLUE CROSS/BLUE SHIELD

## 2016-10-03 NOTE — Progress Notes (Signed)
10/03/2016  Patient arrived for phlebotomy.  Attempted yesterday twice for access and unable to obtain access.  Took 2 IV attempts today to eventually gain access in left hand for Phlebotomy with 22G needle.  Over 40 minutes removed 440 ml and stopped draining.  Per Dr. Pamelia HoitGudena nurse ok to stop at this since this was patients 4th phlebotomy this month.  Per Dr. Earmon PhoenixGudena's notes he will reassess need at next appointment and patient plans to discuss frequency and different types of access due to difficulty gaining access.   Patient remained for 30 minute observation and had no complaints.

## 2016-10-03 NOTE — Patient Instructions (Signed)
Therapeutic Phlebotomy Therapeutic phlebotomy is the controlled removal of blood from a person's body for the purpose of treating a medical condition. The procedure is similar to donating blood. Usually, about a pint (470 mL, or 0.47L) of blood is removed. The average adult has 9-12 pints (4.3-5.7 L) of blood. Therapeutic phlebotomy may be used to treat the following medical conditions:  Hemochromatosis. This is a condition in which the blood contains too much iron.  Polycythemia vera. This is a condition in which the blood contains too many red blood cells.  Porphyria cutanea tarda. This is a disease in which an important part of hemoglobin is not made properly. It results in the buildup of abnormal amounts of porphyrins in the body.  Sickle cell disease. This is a condition in which the red blood cells form an abnormal crescent shape rather than a round shape. Tell a health care provider about:  Any allergies you have.  All medicines you are taking, including vitamins, herbs, eye drops, creams, and over-the-counter medicines.  Any problems you or family members have had with anesthetic medicines.  Any blood disorders you have.  Any surgeries you have had.  Any medical conditions you have. What are the risks? Generally, this is a safe procedure. However, problems may occur, including:  Nausea or light-headedness.  Low blood pressure.  Soreness, bleeding, swelling, or bruising at the needle insertion site.  Infection. What happens before the procedure?  Follow instructions from your health care provider about eating or drinking restrictions.  Ask your health care provider about changing or stopping your regular medicines. This is especially important if you are taking diabetes medicines or blood thinners.  Wear clothing with sleeves that can be raised above the elbow.  Plan to have someone take you home after the procedure.  You may have a blood sample taken. What happens  during the procedure?  A needle will be inserted into one of your veins.  Tubing and a collection bag will be attached to that needle.  Blood will flow through the needle and tubing into the collection bag.  You may be asked to open and close your hand slowly and continually during the entire collection.  After the specified amount of blood has been removed from your body, the collection bag and tubing will be clamped.  The needle will be removed from your vein.  Pressure will be held on the site of the needle insertion to stop the bleeding.  A bandage (dressing) will be placed over the needle insertion site. The procedure may vary among health care providers and hospitals. What happens after the procedure?  Your recovery will be assessed and monitored.  You can return to your normal activities as directed by your health care provider. This information is not intended to replace advice given to you by your health care provider. Make sure you discuss any questions you have with your health care provider. Document Released: 12/26/2010 Document Revised: 03/25/2016 Document Reviewed: 07/20/2014 Elsevier Interactive Patient Education  2017 [employer] Inc.  

## 2016-10-09 ENCOUNTER — Other Ambulatory Visit (HOSPITAL_BASED_OUTPATIENT_CLINIC_OR_DEPARTMENT_OTHER): Payer: BLUE CROSS/BLUE SHIELD

## 2016-10-09 LAB — COMPREHENSIVE METABOLIC PANEL
ALBUMIN: 3.8 g/dL (ref 3.5–5.0)
ALK PHOS: 39 U/L — AB (ref 40–150)
ALT: 22 U/L (ref 0–55)
AST: 45 U/L — AB (ref 5–34)
Anion Gap: 10 mEq/L (ref 3–11)
BILIRUBIN TOTAL: 0.37 mg/dL (ref 0.20–1.20)
BUN: 7.8 mg/dL (ref 7.0–26.0)
CO2: 21 mEq/L — ABNORMAL LOW (ref 22–29)
Calcium: 9 mg/dL (ref 8.4–10.4)
Chloride: 109 mEq/L (ref 98–109)
Creatinine: 0.8 mg/dL (ref 0.6–1.1)
EGFR: >90 mL/min/{1.73_m2} (ref >90)
GLUCOSE: 104 mg/dL (ref 70–140)
POTASSIUM: 4.1 meq/L (ref 3.5–5.1)
SODIUM: 140 meq/L (ref 136–145)
TOTAL PROTEIN: 7 g/dL (ref 6.4–8.3)

## 2016-10-09 LAB — CBC WITH DIFFERENTIAL/PLATELET
BASO%: 1.5 % (ref 0.0–2.0)
Basophils Absolute: 0.1 10*3/uL (ref 0.0–0.1)
EOS%: 11.2 % — ABNORMAL HIGH (ref 0.0–7.0)
Eosinophils Absolute: 0.5 10*3/uL (ref 0.0–0.5)
HEMATOCRIT: 33.1 % — AB (ref 34.8–46.6)
HGB: 10.9 g/dL — ABNORMAL LOW (ref 11.6–15.9)
LYMPH%: 23.3 % (ref 14.0–49.7)
MCH: 34.4 pg — AB (ref 25.1–34.0)
MCHC: 32.9 g/dL (ref 31.5–36.0)
MCV: 104.4 fL — ABNORMAL HIGH (ref 79.5–101.0)
MONO#: 0.4 10*3/uL (ref 0.1–0.9)
MONO%: 8.3 % (ref 0.0–14.0)
NEUT#: 2.6 10*3/uL (ref 1.5–6.5)
NEUT%: 55.7 % (ref 38.4–76.8)
Platelets: 247 10*3/uL (ref 145–400)
RBC: 3.17 10*6/uL — ABNORMAL LOW (ref 3.70–5.45)
RDW: 13.3 % (ref 11.2–14.5)
WBC: 4.7 10*3/uL (ref 3.9–10.3)
lymph#: 1.1 10*3/uL (ref 0.9–3.3)
nRBC: 0 % (ref 0–0)

## 2016-10-09 LAB — FERRITIN: Ferritin: 57 ng/ml (ref 9–269)

## 2016-10-09 LAB — IRON AND TIBC
%SAT: 11 % — ABNORMAL LOW (ref 21–57)
Iron: 37 ug/dL — ABNORMAL LOW (ref 41–142)
TIBC: 336 ug/dL (ref 236–444)
UIBC: 299 ug/dL (ref 120–384)

## 2016-10-10 ENCOUNTER — Ambulatory Visit (HOSPITAL_BASED_OUTPATIENT_CLINIC_OR_DEPARTMENT_OTHER): Payer: BLUE CROSS/BLUE SHIELD | Admitting: Hematology and Oncology

## 2016-10-10 ENCOUNTER — Encounter: Payer: Self-pay | Admitting: Hematology and Oncology

## 2016-10-10 LAB — VITAMIN B12: Vitamin B12: 309 pg/mL (ref 232–1245)

## 2016-10-10 LAB — FOLATE: FOLATE: 3.7 ng/mL (ref >3.0)

## 2016-10-10 NOTE — Assessment & Plan Note (Signed)
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 I reviewed her blood work which showed that she had a ferritin of 243. Iron saturation was 99%.  Treatment: Phlebotomy weekly 4 started 09/11/2016-10/03/16 Lab work review 10/09/2016: Hemoglobin 10.9, B-12 309, folate 3.7, iron saturation 11%, ferritin 57 Recommendation: Once a month phlebotomy, goal to keep ferritin below 100 and iron saturation below 50%  I discussed with the patient that she does not need phlebotomy today. Return to clinic in one month for lab count check (day before) and phlebotomy if necessary.

## 2016-10-10 NOTE — Progress Notes (Signed)
Patient Care Team: Farris HasAaron Morrow, MD as PCP - General (Family Medicine)  DIAGNOSIS:  Encounter Diagnosis  Name Primary?  . Hemochromatosis, hereditary (HCC)    CHIEF COMPLIANT: Follow-up of hemochromatosis  INTERVAL HISTORY: Samantha [name] is a 26 year old with above-mentioned history of hereditary hemochromatosis who is currently undergone phlebotomy treatments 4 and is here today to discuss the results of her recent blood work. She has had difficulty with IV access for phlebotomies.  REVIEW OF SYSTEMS:   Constitutional: Denies fevers, chills or abnormal weight loss Eyes: Denies blurriness of vision Ears, nose, mouth, throat, and face: Denies mucositis or sore throat Respiratory: Denies cough, dyspnea or wheezes Cardiovascular: Denies palpitation, chest discomfort Gastrointestinal:  Denies nausea, heartburn or change in bowel habits Skin: Denies abnormal skin rashes Lymphatics: Denies new lymphadenopathy or easy bruising Neurological:Denies numbness, tingling or new weaknesses Behavioral/Psych: Mood is stable, no new changes  Extremities: No lower extremity edema  All other systems were reviewed with the patient and are negative.  I have reviewed the past medical history, past surgical history, social history and family history with the patient and they are unchanged from previous note.  ALLERGIES:  is allergic to sulfa antibiotics.  MEDICATIONS:  Current Outpatient Prescriptions  Medication Sig Dispense Refill  . clonazePAM (KLONOPIN) 0.5 MG tablet Take 0.5 mg by mouth as needed for anxiety.    Marland Kitchen. FLUoxetine (PROZAC) 40 MG capsule Take 1 capsule by mouth daily.    Marland Kitchen. ibuprofen (ADVIL,MOTRIN) 200 MG tablet Take 600 mg by mouth every 6 (six) hours as needed for moderate pain.    Marland Kitchen. levonorgestrel-ethinyl estradiol (SEASONALE,INTROVALE,JOLESSA) 0.15-0.03 MG tablet Take 1 tablet by mouth daily.  4  . omeprazole (PRILOSEC) 40 MG capsule Take 40 mg by mouth daily.    . ondansetron  (ZOFRAN) 4 MG tablet Take 1 tablet (4 mg total) by mouth every 6 (six) hours. (Patient taking differently: Take 4 mg by mouth every 8 (eight) hours as needed for nausea or vomiting. ) 12 tablet 0   No current facility-administered medications for this visit.     PHYSICAL EXAMINATION: ECOG PERFORMANCE STATUS: 1 - Symptomatic but completely ambulatory  Vitals:   10/10/16 1127  BP: 124/73  Pulse: 78  Resp: 20  Temp: 97.7 F (36.5 C)   Filed Weights   10/10/16 1127  Weight: 194 lb 8 oz (88.2 kg)    GENERAL:alert, no distress and comfortable SKIN: skin color, texture, turgor are normal, no rashes or significant lesions EYES: normal, Conjunctiva are pink and non-injected, sclera clear OROPHARYNX:no exudate, no erythema and lips, buccal mucosa, and tongue normal  NECK: supple, thyroid normal size, non-tender, without nodularity LYMPH:  no palpable lymphadenopathy in the cervical, axillary or inguinal LUNGS: clear to auscultation and percussion with normal breathing effort HEART: regular rate & rhythm and no murmurs and no lower extremity edema ABDOMEN:abdomen soft, non-tender and normal bowel sounds MUSCULOSKELETAL:no cyanosis of digits and no clubbing  NEURO: alert & oriented x 3 with fluent speech, no focal motor/sensory deficits EXTREMITIES: No lower extremity edema  LABORATORY DATA:  I have reviewed the data as listed   Chemistry      Component Value Date/Time   NA 140 10/09/2016 1209   K 4.1 10/09/2016 1209   CL 100 (L) 06/18/2016 2023   CO2 21 (L) 10/09/2016 1209   BUN 7.8 10/09/2016 1209   CREATININE 0.8 10/09/2016 1209      Component Value Date/Time   CALCIUM 9.0 10/09/2016 1209   ALKPHOS 39 (  L) 10/09/2016 1209   AST 45 (H) 10/09/2016 1209   ALT 22 10/09/2016 1209   BILITOT 0.37 10/09/2016 1209       Lab Results  Component Value Date   WBC 4.7 10/09/2016   HGB 10.9 (L) 10/09/2016   HCT 33.1 (L) 10/09/2016   MCV 104.4 (H) 10/09/2016   PLT 247 10/09/2016     NEUTROABS 2.6 10/09/2016    ASSESSMENT & PLAN:  Hemochromatosis, 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 I reviewed her blood work which showed that she had a ferritin of 243. Iron saturation was 99%.  Treatment: Phlebotomy weekly 4 started 09/11/2016-10/03/16 Lab work review 10/09/2016: Hemoglobin 10.9, B-12 309, folate 3.7, iron saturation 11%, ferritin 57 Recommendation: Once a month phlebotomy, goal to keep ferritin below 100 and iron saturation below 50%  I discussed with the patient that she does not need phlebotomy today. Return to clinic once a month for blood count check and once every 3 months to follow-up with me. If on the blood count check her ferritin is greater than 100 or iron saturations more than 50%, then we will set her up for phlebotomy.  I spent 25 minutes talking to the patient of which more than half was spent in counseling and coordination of care.  No orders of the defined types were placed in this encounter.  The patient has a good understanding of the overall plan. she agrees with it. she will call with any problems that may develop before the next visit here.   Sabas Sous, MD 10/10/16

## 2016-11-07 ENCOUNTER — Other Ambulatory Visit (HOSPITAL_BASED_OUTPATIENT_CLINIC_OR_DEPARTMENT_OTHER): Payer: BLUE CROSS/BLUE SHIELD

## 2016-11-07 LAB — IRON AND TIBC
%SAT: 33 % (ref 21–57)
IRON: 93 ug/dL (ref 41–142)
TIBC: 285 ug/dL (ref 236–444)
UIBC: 192 ug/dL (ref 120–384)

## 2016-11-07 LAB — CBC WITH DIFFERENTIAL/PLATELET
BASO%: 1.4 % (ref 0.0–2.0)
Basophils Absolute: 0.1 10*3/uL (ref 0.0–0.1)
EOS ABS: 0.3 10*3/uL (ref 0.0–0.5)
EOS%: 7.1 % — ABNORMAL HIGH (ref 0.0–7.0)
HCT: 37.2 % (ref 34.8–46.6)
HGB: 12.2 g/dL (ref 11.6–15.9)
LYMPH%: 36.1 % (ref 14.0–49.7)
MCH: 32.9 pg (ref 25.1–34.0)
MCHC: 32.8 g/dL (ref 31.5–36.0)
MCV: 100.2 fL (ref 79.5–101.0)
MONO#: 0.4 10*3/uL (ref 0.1–0.9)
MONO%: 10.6 % (ref 0.0–14.0)
NEUT%: 44.8 % (ref 38.4–76.8)
NEUTROS ABS: 1.6 10*3/uL (ref 1.5–6.5)
Platelets: 152 10*3/uL (ref 145–400)
RBC: 3.72 10*6/uL (ref 3.70–5.45)
RDW: 15.1 % — ABNORMAL HIGH (ref 11.2–14.5)
WBC: 3.6 10*3/uL — AB (ref 3.9–10.3)
lymph#: 1.3 10*3/uL (ref 0.9–3.3)

## 2016-11-07 LAB — FERRITIN: Ferritin: 37 ng/ml (ref 9–269)

## 2016-11-13 ENCOUNTER — Other Ambulatory Visit: Payer: BLUE CROSS/BLUE SHIELD

## 2016-11-13 ENCOUNTER — Ambulatory Visit: Payer: BLUE CROSS/BLUE SHIELD | Admitting: Hematology and Oncology

## 2016-12-04 ENCOUNTER — Other Ambulatory Visit: Payer: Self-pay

## 2016-12-05 ENCOUNTER — Other Ambulatory Visit: Payer: BLUE CROSS/BLUE SHIELD

## 2016-12-07 ENCOUNTER — Other Ambulatory Visit (HOSPITAL_BASED_OUTPATIENT_CLINIC_OR_DEPARTMENT_OTHER): Payer: BLUE CROSS/BLUE SHIELD

## 2016-12-07 LAB — IRON AND TIBC
%SAT: 80 % — ABNORMAL HIGH (ref 21–57)
Iron: 208 ug/dL — ABNORMAL HIGH (ref 41–142)
TIBC: 260 ug/dL (ref 236–444)
UIBC: 52 ug/dL — ABNORMAL LOW (ref 120–384)

## 2016-12-07 LAB — CBC WITH DIFFERENTIAL/PLATELET
BASO%: 0.9 % (ref 0.0–2.0)
Basophils Absolute: 0.1 10*3/uL (ref 0.0–0.1)
EOS%: 1.9 % (ref 0.0–7.0)
Eosinophils Absolute: 0.1 10*3/uL (ref 0.0–0.5)
HEMATOCRIT: 39.5 % (ref 34.8–46.6)
HEMOGLOBIN: 12.8 g/dL (ref 11.6–15.9)
LYMPH#: 1.1 10*3/uL (ref 0.9–3.3)
LYMPH%: 16.8 % (ref 14.0–49.7)
MCH: 32.4 pg (ref 25.1–34.0)
MCHC: 32.5 g/dL (ref 31.5–36.0)
MCV: 99.5 fL (ref 79.5–101.0)
MONO#: 0.5 10*3/uL (ref 0.1–0.9)
MONO%: 7.7 % (ref 0.0–14.0)
NEUT#: 5 10*3/uL (ref 1.5–6.5)
NEUT%: 72.7 % (ref 38.4–76.8)
PLATELETS: 167 10*3/uL (ref 145–400)
RBC: 3.97 10*6/uL (ref 3.70–5.45)
RDW: 16.5 % — ABNORMAL HIGH (ref 11.2–14.5)
WBC: 6.8 10*3/uL (ref 3.9–10.3)

## 2016-12-07 LAB — FERRITIN: FERRITIN: 53 ng/mL (ref 9–269)

## 2016-12-18 ENCOUNTER — Encounter (HOSPITAL_COMMUNITY): Payer: Self-pay | Admitting: Emergency Medicine

## 2016-12-18 ENCOUNTER — Emergency Department (HOSPITAL_COMMUNITY)
Admission: EM | Admit: 2016-12-18 | Discharge: 2016-12-19 | Disposition: A | Discharge status: Home / Self Care | Payer: BLUE CROSS/BLUE SHIELD | Attending: Emergency Medicine | Admitting: Emergency Medicine

## 2016-12-18 DIAGNOSIS — R112 Nausea with vomiting, unspecified: Secondary | ICD-10-CM | POA: Insufficient documentation

## 2016-12-18 DIAGNOSIS — Z79899 Other long term (current) drug therapy: Secondary | ICD-10-CM | POA: Diagnosis not present

## 2016-12-18 DIAGNOSIS — R197 Diarrhea, unspecified: Secondary | ICD-10-CM | POA: Insufficient documentation

## 2016-12-18 LAB — COMPREHENSIVE METABOLIC PANEL
ALBUMIN: 3.9 g/dL (ref 3.5–5.0)
ALT: 32 U/L (ref 14–54)
AST: 93 U/L — ABNORMAL HIGH (ref 15–41)
Alkaline Phosphatase: 37 U/L — ABNORMAL LOW (ref 38–126)
Anion gap: 11 (ref 5–15)
BILIRUBIN TOTAL: 1.8 mg/dL — AB (ref 0.3–1.2)
CALCIUM: 9.1 mg/dL (ref 8.9–10.3)
CHLORIDE: 104 mmol/L (ref 101–111)
CO2: 21 mmol/L — ABNORMAL LOW (ref 22–32)
CREATININE: 0.98 mg/dL (ref 0.44–1.00)
GFR calc Af Amer: >60 mL/min (ref >60)
Glucose, Bld: 97 mg/dL (ref 65–99)
POTASSIUM: 4.2 mmol/L (ref 3.5–5.1)
Sodium: 136 mmol/L (ref 135–145)
Total Protein: 6.9 g/dL (ref 6.5–8.1)

## 2016-12-18 LAB — URINALYSIS, MICROSCOPIC (REFLEX)

## 2016-12-18 LAB — URINALYSIS, ROUTINE W REFLEX MICROSCOPIC
GLUCOSE, UA: NEGATIVE mg/dL
Ketones, ur: 15 mg/dL — AB
Nitrite: POSITIVE — AB
PH: 5 (ref 5.0–8.0)
Protein, ur: NEGATIVE mg/dL

## 2016-12-18 LAB — CBC
HEMATOCRIT: 39.4 % (ref 36.0–46.0)
Hemoglobin: 13.2 g/dL (ref 12.0–15.0)
MCH: 32.4 pg (ref 26.0–34.0)
MCHC: 33.5 g/dL (ref 30.0–36.0)
MCV: 96.8 fL (ref 78.0–100.0)
Platelets: 210 10*3/uL (ref 150–400)
RBC: 4.07 MIL/uL (ref 3.87–5.11)
RDW: 15.6 % — ABNORMAL HIGH (ref 11.5–15.5)
WBC: 8 10*3/uL (ref 4.0–10.5)

## 2016-12-18 LAB — I-STAT BETA HCG BLOOD, ED (MC, WL, AP ONLY): I-stat hCG, quantitative: <5 m[IU]/mL (ref <5)

## 2016-12-18 MED ORDER — DIPHENHYDRAMINE HCL 25 MG PO CAPS
25.0000 mg | ORAL_CAPSULE | Freq: Once | ORAL | Status: AC
  Administered 2016-12-18: 25 mg via ORAL
  Filled 2016-12-18: qty 1

## 2016-12-18 MED ORDER — METOCLOPRAMIDE HCL 5 MG/ML IJ SOLN
10.0000 mg | Freq: Once | INTRAMUSCULAR | Status: AC
  Administered 2016-12-18: 10 mg via INTRAVENOUS
  Filled 2016-12-18: qty 2

## 2016-12-18 MED ORDER — SODIUM CHLORIDE 0.9 % IV BOLUS (SEPSIS)
1000.0000 mL | Freq: Once | INTRAVENOUS | Status: AC
  Administered 2016-12-18: 1000 mL via INTRAVENOUS

## 2016-12-18 NOTE — ED Triage Notes (Addendum)
Patient reports multiple emesis and diarrhea for 3 days unrelieved by prescription Zofran  , [facility] advised to go to ER for evaluation , denies fever or chills .

## 2016-12-18 NOTE — ED Provider Notes (Signed)
[facility]-EMERGENCY DEPT Provider Note   CSN: [id number] Arrival date & time: 12/18/16  1843     History   Chief Complaint Chief Complaint  Patient presents with  . Emesis  . Diarrhea    HPI Samantha [name] is a 26 y.o. female.  This a [age] year female with frequent episodes of nausea, vomiting and diarrhea.  She's been followed by gastroenterology with no definitive diagnosis.  The symptoms have been present for the past 4 days.  She's tried taking over-the-counter Imodium and one time yesterday.  She took Zofran with no decrease in her symptoms. Denies any fever, vaginal discharge, dysuria, cough.       Past Medical History:  Diagnosis Date  . Anxiety   . Depression   . Environmental allergies   . Giardia   . Hemochromatosis   . Menorrhagia     Patient Active Problem List   Diagnosis Date Noted  . Hemochromatosis, hereditary (HCC) 09/11/2016  . Seizures (HCC) 07/03/2016    Past Surgical History:  Procedure Laterality Date  . COSMETIC SURGERY     Removal of birthmark on face.  Marland Kitchen. FOOT SURGERY Left     OB History    No data available       Home Medications    Prior to Admission medications   Medication Sig Start Date End Date Taking? Authorizing Provider  clonazePAM (KLONOPIN) 0.5 MG tablet Take 0.5 mg by mouth as needed for anxiety.   Yes [provider]  Cyanocobalamin (VITAMIN B-12 PO) Take 1 tablet by mouth daily.   Yes [provider]  folic acid (FOLVITE) 1 MG tablet Take 1 mg by mouth daily.   Yes [provider]  ibuprofen (ADVIL,MOTRIN) 200 MG tablet Take 600 mg by mouth every 6 (six) hours as needed for moderate pain.   Yes [provider]  levonorgestrel-ethinyl estradiol (SEASONALE,INTROVALE,JOLESSA) 0.15-0.03 MG tablet Take 1 tablet by mouth daily. 12/26/14  Yes [provider]  MAGNESIUM PO Take 1 tablet by mouth daily.   Yes [provider]  omeprazole (PRILOSEC) 40 MG capsule Take 40 mg by mouth  daily.   Yes [provider]  ondansetron (ZOFRAN) 4 MG tablet Take 1 tablet (4 mg total) by mouth every 6 (six) hours. Patient taking differently: Take 4 mg by mouth every 8 (eight) hours as needed for nausea or vomiting.  02/07/15  Yes Jerelyn ScottLinker, Martha, MD  metoCLOPramide (REGLAN) 10 MG tablet Take 1 tablet (10 mg total) by mouth every 6 (six) hours. 12/19/16   Earley FavorSchulz, Takeem Krotzer, NP    Family History Family History  Problem Relation Age of Onset  . Anxiety disorder Mother   . Depression Mother   . Asthma Father   . Psoriasis Father   . Hyperlipidemia Father   . Gout Father     Social History Social History  Substance Use Topics  . Smoking status: Never Smoker  . Smokeless tobacco: Never Used  . Alcohol use Yes     Comment: daily (beer or two a day)     Allergies   Sulfa antibiotics   Review of Systems Review of Systems  Constitutional: Negative for fever.  Respiratory: Negative for cough.   Gastrointestinal: Positive for abdominal pain, diarrhea, nausea and vomiting. Negative for abdominal distention.  Genitourinary: Negative for dysuria.  All other systems reviewed and are negative.    Physical Exam Updated Vital Signs BP (!) 135/92   Pulse 69   Temp 98.6 F (37 C) (Oral)  Resp 16   Ht 5\' 11"  (1.803 m)   Wt 86.2 kg   LMP 12/04/2016 (Approximate)   SpO2 100%   BMI 26.50 kg/m   Physical Exam  Constitutional: She appears well-developed and well-nourished.  HENT:  Head: Normocephalic.  Eyes: Pupils are equal, round, and reactive to light.  Neck: Normal range of motion.  Cardiovascular: Normal rate.   Pulmonary/Chest: Effort normal.  Abdominal: Soft. Bowel sounds are normal. She exhibits no distension. There is no tenderness.  Musculoskeletal: Normal range of motion.  Neurological: She is alert.  Skin: Skin is warm.  Psychiatric: She has a normal mood and affect.  Nursing note and vitals reviewed.    ED Treatments / Results  Labs (all labs  ordered are listed, but only abnormal results are displayed) Labs Reviewed  COMPREHENSIVE METABOLIC PANEL - Abnormal; Notable for the following:       Result Value   CO2 21 (*)    BUN <5 (*)    AST 93 (*)    Alkaline Phosphatase 37 (*)    Total Bilirubin 1.8 (*)    All other components within normal limits  CBC - Abnormal; Notable for the following:    RDW 15.6 (*)    All other components within normal limits  URINALYSIS, ROUTINE W REFLEX MICROSCOPIC - Abnormal; Notable for the following:    APPearance HAZY (*)    Specific Gravity, Urine >1.030 (*)    Hgb urine dipstick TRACE (*)    Bilirubin Urine MODERATE (*)    Ketones, ur 15 (*)    Nitrite POSITIVE (*)    Leukocytes, UA TRACE (*)    All other components within normal limits  URINALYSIS, MICROSCOPIC (REFLEX) - Abnormal; Notable for the following:    Bacteria, UA MANY (*)    Squamous Epithelial / LPF 6-30 (*)    All other components within normal limits  I-STAT BETA HCG BLOOD, ED ([facility], [facility], [facility] ONLY)    EKG  EKG Interpretation None       Radiology No results found.  Procedures Procedures (including critical care time)  Medications Ordered in ED Medications  sodium chloride 0.9 % bolus 1,000 mL (0 mLs Intravenous Stopped 12/19/16 0215)  metoCLOPramide (REGLAN) injection 10 mg (10 mg Intravenous Given 12/18/16 2332)  diphenhydrAMINE (BENADRYL) capsule 25 mg (25 mg Oral Given 12/18/16 2331)     Initial Impression / Assessment and Plan / ED Course  I have reviewed the triage vital signs and the nursing notes.  Pertinent labs & imaging results that were available during my care of the patient were reviewed by me and considered in my medical decision making (see chart for details).      Patient's labs were reviewed all within normal parameters.  She's been given a liter of IV saline as well as Reglan and Benadryl with resolution of her symptoms.  She will be discharged home with prescription for by mouth Reglan with  precaution to take with 12 1/2-25 mg Benadryl to follow-up with her PCP. Urine showed bacteria and nitrates without leukocytes .  I did not treat this at this time due to the patient's diarrhea and she is asymptomatic as far as urinary tract infection Final Clinical Impressions(s) / ED Diagnoses   Final diagnoses:  Nausea vomiting and diarrhea    New Prescriptions Discharge Medication List as of 12/19/2016  2:16 AM    START taking these medications   Details  metoCLOPramide (REGLAN) 10 MG tablet Take 1 tablet (10 mg  total) by mouth every 6 (six) hours., Starting Tue 12/19/2016, Print         Earley Favor, NP 12/19/16 0217    Earley Favor, NP 12/19/16 Kizzie Fantasia, MD 12/20/16 1329

## 2016-12-19 MED ORDER — METOCLOPRAMIDE HCL 10 MG PO TABS: 10.0000 mg | ORAL_TABLET | Freq: Four times a day (QID) | ORAL | 0 refills | Status: AC

## 2016-12-19 NOTE — Discharge Instructions (Signed)
Today your evaluated for nausea, vomiting, diarrhea, that it been going on for 4 days.  She was given IV fluids and Reglan with Benadryl.  This seemed to work for your nausea.  You have been given a prescription for Reglan.  Please take this with 12 and half to 25 mg of Benadryl, as it does cause some anxiety occasionally in certain people , the use of Benadryl will blunt that reaction.  Recommended he start taking probiotics as well, as this will replace the normal exterior within your intestines.  I would call your primary care physician and perhaps ask [facility] as you have not been completely happy with a local gastroenterologist.

## 2017-01-02 ENCOUNTER — Ambulatory Visit (INDEPENDENT_AMBULATORY_CARE_PROVIDER_SITE_OTHER): Payer: BLUE CROSS/BLUE SHIELD | Admitting: Neurology

## 2017-01-02 ENCOUNTER — Encounter: Payer: Self-pay | Admitting: Neurology

## 2017-01-02 VITALS — BP 140/91 | HR 61 | Ht 71.0 in | Wt 188.5 lb

## 2017-01-02 DIAGNOSIS — R569 Unspecified convulsions: Secondary | ICD-10-CM | POA: Diagnosis not present

## 2017-01-02 MED ORDER — LEVETIRACETAM 250 MG PO TABS: 250.0000 mg | ORAL_TABLET | Freq: Two times a day (BID) | ORAL | 11 refills | Status: DC

## 2017-01-02 NOTE — Progress Notes (Signed)
PATIENT: Samantha [name] DOB: 08-21-1990  Chief Complaint  Patient presents with  . Seizures    Reports no further seizure events.  She would like to further review her MRI and EEG results.     HISTORICAL  Samantha [name] is 26 years old right-handed female accompanied by her boyfriend Samantha [name], seen in refer by her primary care physician Dr.  Farris Has for evaluation of seizure, initial evaluation was on July 03 2016.  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  I personally reviewed CT head without contrast that was normal Laboratory evaluation showed normal magnesium, UDS was negative, CBC was normal, with hemoglobin of 12.9, BMP showed low sodium 133, glucose was mildly elevated 113, creatinine 1.03,  UPDATE Jan 02 2017: She has no recurrent seizure, we have personally reviewed MRI of the brain with and without contrast in December 2017, there was no acute abnormality, EEG was normal in December 2017,  I reviewed her seizure seminology, patient had right arm raising above head, body turning towards the right side, fell backwards, tonic-clonic seizure,    REVIEW OF SYSTEMS: Full 14 system review of systems performed and notable only for as above  ALLERGIES: Allergies  Allergen Reactions  . Sulfa Antibiotics Hives and Rash    HOME MEDICATIONS: Current Outpatient  Prescriptions  Medication Sig Dispense Refill  . clonazePAM (KLONOPIN) 0.5 MG tablet Take 0.5 mg by mouth as needed for anxiety.    . Cyanocobalamin (VITAMIN B-12 PO) Take 1 tablet by mouth daily.    . folic acid (FOLVITE) 1 MG tablet Take 1 mg by mouth daily.    Marland Kitchen ibuprofen (ADVIL,MOTRIN) 200 MG tablet Take 600 mg by mouth every 6 (six) hours as needed for moderate pain.    Marland Kitchen levonorgestrel-ethinyl estradiol (SEASONALE,INTROVALE,JOLESSA) 0.15-0.03 MG tablet Take 1 tablet by mouth daily.  4  . MAGNESIUM PO Take 1 tablet by mouth daily.    . metoCLOPramide (REGLAN) 10 MG tablet Take 1 tablet (10 mg total) by mouth every 6 (six) hours. 30 tablet 0  . ondansetron (ZOFRAN) 4 MG tablet Take 1 tablet (4 mg total) by mouth every 6 (six) hours. (Patient taking differently: Take 4 mg by mouth every 8 (eight) hours as needed for nausea or vomiting. ) 12 tablet 0   No current facility-administered medications for this visit.     PAST MEDICAL HISTORY: Past Medical History:  Diagnosis Date  . Anxiety   . Depression   . Environmental allergies   . Giardia   . Hemochromatosis   . Menorrhagia     PAST SURGICAL HISTORY: Past Surgical History:  Procedure Laterality Date  . COSMETIC SURGERY     Removal of birthmark on face.  Marland Kitchen FOOT SURGERY Left     FAMILY HISTORY: Family History  Problem Relation Age of Onset  . Anxiety disorder Mother   . Depression Mother   . Asthma Father   .  Psoriasis Father   . Hyperlipidemia Father   . Gout Father     SOCIAL HISTORY:  Social History   Social History  . Marital status: Single    Spouse name: N/A  . Number of children: 0  . Years of education: Bachelors   Occupational History  . Customer Service    Social History Main Topics  . Smoking status: Never Smoker  . Smokeless tobacco: Never Used  . Alcohol use Yes     Comment: daily (beer or two a day)  . Drug use: Yes    Types: Marijuana     Comment: Occasional use.  Marland Kitchen Sexual activity: Not  on file   Other Topics Concern  . Not on file   Social History Narrative   Lives at home with boyfriend.   Right-handed.   Occasional caffeine use.     PHYSICAL EXAM   Vitals:   01/02/17 0954  BP: (!) 140/91  Pulse: 61  Weight: 188 lb 8 oz (85.5 kg)  Height: 5\' 11"  (1.803 m)    Not recorded      Body mass index is 26.29 kg/m.  PHYSICAL EXAMNIATION:  Gen: NAD, conversant, well nourised, obese, well groomed                     Cardiovascular: Regular rate rhythm, no peripheral edema, warm, nontender. Eyes: Conjunctivae clear without exudates or hemorrhage Neck: Supple, no carotid bruits. Pulmonary: Clear to auscultation bilaterally   NEUROLOGICAL EXAM:  MENTAL STATUS: Speech:    Speech is normal; fluent and spontaneous with normal comprehension.  Cognition:     Orientation to time, place and person     Normal recent and remote memory     Normal Attention span and concentration     Normal Language, naming, repeating,spontaneous speech     Fund of knowledge   CRANIAL NERVES: CN II: Visual fields are full to confrontation. Fundoscopic exam is normal with sharp discs and no vascular changes. Pupils are round equal and briskly reactive to light. CN III, IV, VI: extraocular movement are normal. No ptosis. CN V: Facial sensation is intact to pinprick in all 3 divisions bilaterally. Corneal responses are intact.  CN VII: Face is symmetric with normal eye closure and smile. CN VIII: Hearing is normal to rubbing fingers CN IX, X: Palate elevates symmetrically. Phonation is normal. CN XI: Head turning and shoulder shrug are intact CN XII: Tongue is midline with normal movements and no atrophy.  MOTOR: There is no pronator drift of out-stretched arms. Muscle bulk and tone are normal. Muscle strength is normal.  REFLEXES: Reflexes are 2+ and symmetric at the biceps, triceps, knees, and ankles. Plantar responses are flexor.  SENSORY: Intact to light touch, pinprick,  positional sensation and vibratory sensation are intact in fingers and toes.  COORDINATION: Rapid alternating movements and fine finger movements are intact. There is no dysmetria on finger-to-nose and heel-knee-shin.    GAIT/STANCE: Posture is normal. Gait is steady with normal steps, base, arm swing, and turning. Heel and toe walking are normal. Tandem gait is normal.  Romberg is absent.   DIAGNOSTIC DATA (LABS, IMAGING, TESTING) - I reviewed patient records, labs, notes, testing and imaging myself where available.   ASSESSMENT AND PLAN  Samantha [name] is a 26 y.o. female   Seizure on November 12th 2017  Normal MRI of the brain, EEG  We have reviewed seminology today, suggestive of left frontal as focus, complex partial seizure  After  extensive discussion with patient, reviewed multiple medication choices, we decided to proceed with low-dose Keppra 250 mg twice a day  Advise patient take multivitamin folic acid,   Levert FeinsteinYijun Audrea Bolte, M.D. Ph.D.  [facility] [address], [address] [place], [place] [address] Ph: [phone] Fax: [phone]  CC: Farris HasAaron Morrow, MD

## 2017-01-08 ENCOUNTER — Other Ambulatory Visit: Payer: Self-pay | Admitting: Emergency Medicine

## 2017-01-09 ENCOUNTER — Encounter: Payer: Self-pay | Admitting: Hematology and Oncology

## 2017-01-09 ENCOUNTER — Ambulatory Visit (HOSPITAL_BASED_OUTPATIENT_CLINIC_OR_DEPARTMENT_OTHER): Payer: BLUE CROSS/BLUE SHIELD | Admitting: Hematology and Oncology

## 2017-01-09 ENCOUNTER — Other Ambulatory Visit (HOSPITAL_BASED_OUTPATIENT_CLINIC_OR_DEPARTMENT_OTHER): Payer: BLUE CROSS/BLUE SHIELD

## 2017-01-09 DIAGNOSIS — R748 Abnormal levels of other serum enzymes: Secondary | ICD-10-CM

## 2017-01-09 DIAGNOSIS — R569 Unspecified convulsions: Secondary | ICD-10-CM

## 2017-01-09 LAB — CBC WITH DIFFERENTIAL/PLATELET
BASO%: 1.1 % (ref 0.0–2.0)
Basophils Absolute: 0.1 10*3/uL (ref 0.0–0.1)
EOS%: 5.1 % (ref 0.0–7.0)
Eosinophils Absolute: 0.3 10*3/uL (ref 0.0–0.5)
HCT: 38.4 % (ref 34.8–46.6)
HGB: 12.6 g/dL (ref 11.6–15.9)
LYMPH%: 29 % (ref 14.0–49.7)
MCH: 33.4 pg (ref 25.1–34.0)
MCHC: 32.9 g/dL (ref 31.5–36.0)
MCV: 101.5 fL — ABNORMAL HIGH (ref 79.5–101.0)
MONO#: 0.4 10*3/uL (ref 0.1–0.9)
MONO%: 7.7 % (ref 0.0–14.0)
NEUT%: 57.1 % (ref 38.4–76.8)
NEUTROS ABS: 2.9 10*3/uL (ref 1.5–6.5)
Platelets: 176 10*3/uL (ref 145–400)
RBC: 3.79 10*6/uL (ref 3.70–5.45)
RDW: 16.6 % — AB (ref 11.2–14.5)
WBC: 5.1 10*3/uL (ref 3.9–10.3)
lymph#: 1.5 10*3/uL (ref 0.9–3.3)

## 2017-01-09 LAB — IRON AND TIBC
%SAT: 35 % (ref 21–57)
IRON: 85 ug/dL (ref 41–142)
TIBC: 246 ug/dL (ref 236–444)
UIBC: 161 ug/dL (ref 120–384)

## 2017-01-09 LAB — FERRITIN: FERRITIN: 69 ng/mL (ref 9–269)

## 2017-01-09 NOTE — Progress Notes (Signed)
Patient Care Team: Farris Has, MD as PCP - General (Family Medicine)  DIAGNOSIS:  Encounter Diagnosis  Name Primary?  . Hemochromatosis, hereditary (HCC)    Current treatment: Phlebotomy every 2 months CHIEF COMPLIANT: Follow-up of hemochromatosis  INTERVAL HISTORY: Samantha [name] is a 26 year old with above-mentioned history of hemochromatosis who had previously undergone phlebotomy once a week 4 weeks. Currently on phlebotomy. She has not undergone phlebotomy for the past 3 months. She has had moderate elevation of AST levels to distal of worsening hemochromatosis.   REVIEW OF SYSTEMS:   Constitutional: Denies fevers, chills or abnormal weight loss Eyes: Denies blurriness of vision Ears, nose, mouth, throat, and face: Denies mucositis or sore throat Respiratory: Denies cough, dyspnea or wheezes Cardiovascular: Denies palpitation, chest discomfort Gastrointestinal:  Denies nausea, heartburn or change in bowel habits Skin: Denies abnormal skin rashes Lymphatics: Denies new lymphadenopathy or easy bruising Neurological:Denies numbness, tingling or new weaknesses Behavioral/Psych: Mood is stable, no new changes  Extremities: No lower extremity edema Breast:  denies any pain or lumps or nodules in either breasts All other systems were reviewed with the patient and are negative.  I have reviewed the past medical history, past surgical history, social history and family history with the patient and they are unchanged from previous note.  ALLERGIES:  is allergic to sulfa antibiotics.  MEDICATIONS:  Current Outpatient Prescriptions  Medication Sig Dispense Refill  . clonazePAM (KLONOPIN) 0.5 MG tablet Take 0.5 mg by mouth as needed for anxiety.    . Cyanocobalamin (VITAMIN B-12 PO) Take 1 tablet by mouth daily.    . folic acid (FOLVITE) 1 MG tablet Take 1 mg by mouth daily.    Marland Kitchen ibuprofen (ADVIL,MOTRIN) 200 MG tablet Take 600 mg by mouth every 6 (six) hours as needed for moderate  pain.    Marland Kitchen levETIRAcetam (KEPPRA) 250 MG tablet Take 1 tablet (250 mg total) by mouth 2 (two) times daily. 60 tablet 11  . levonorgestrel-ethinyl estradiol (SEASONALE,INTROVALE,JOLESSA) 0.15-0.03 MG tablet Take 1 tablet by mouth daily.  4  . MAGNESIUM PO Take 1 tablet by mouth daily.    . metoCLOPramide (REGLAN) 10 MG tablet Take 1 tablet (10 mg total) by mouth every 6 (six) hours. 30 tablet 0  . ondansetron (ZOFRAN) 4 MG tablet Take 1 tablet (4 mg total) by mouth every 6 (six) hours. (Patient taking differently: Take 4 mg by mouth every 8 (eight) hours as needed for nausea or vomiting. ) 12 tablet 0   No current facility-administered medications for this visit.     PHYSICAL EXAMINATION: ECOG PERFORMANCE STATUS: 1 - Symptomatic but completely ambulatory  Vitals:   01/09/17 1005  BP: 129/77  Pulse: 70  Resp: 18  Temp: 98.1 F (36.7 C)   Filed Weights   01/09/17 1005  Weight: 183 lb (83 kg)    GENERAL:alert, no distress and comfortable SKIN: skin color, texture, turgor are normal, no rashes or significant lesions EYES: normal, Conjunctiva are pink and non-injected, sclera clear OROPHARYNX:no exudate, no erythema and lips, buccal mucosa, and tongue normal  NECK: supple, thyroid normal size, non-tender, without nodularity LYMPH:  no palpable lymphadenopathy in the cervical, axillary or inguinal LUNGS: clear to auscultation and percussion with normal breathing effort HEART: regular rate & rhythm and no murmurs and no lower extremity edema ABDOMEN:abdomen soft, non-tender and normal bowel sounds MUSCULOSKELETAL:no cyanosis of digits and no clubbing  NEURO: alert & oriented x 3 with fluent speech, no focal motor/sensory deficits EXTREMITIES: No lower extremity edema  LABORATORY DATA:  I have reviewed the data as listed   Chemistry      Component Value Date/Time   NA 136 12/18/2016 2000   NA 140 10/09/2016 1209   K 4.2 12/18/2016 2000   K 4.1 10/09/2016 1209   CL 104  12/18/2016 2000   CO2 21 (L) 12/18/2016 2000   CO2 21 (L) 10/09/2016 1209   BUN <5 (L) 12/18/2016 2000   BUN 7.8 10/09/2016 1209   CREATININE 0.98 12/18/2016 2000   CREATININE 0.8 10/09/2016 1209      Component Value Date/Time   CALCIUM 9.1 12/18/2016 2000   CALCIUM 9.0 10/09/2016 1209   ALKPHOS 37 (L) 12/18/2016 2000   ALKPHOS 39 (L) 10/09/2016 1209   AST 93 (H) 12/18/2016 2000   AST 45 (H) 10/09/2016 1209   ALT 32 12/18/2016 2000   ALT 22 10/09/2016 1209   BILITOT 1.8 (H) 12/18/2016 2000   BILITOT 0.37 10/09/2016 1209       Lab Results  Component Value Date   WBC 5.1 01/09/2017   HGB 12.6 01/09/2017   HCT 38.4 01/09/2017   MCV 101.5 (H) 01/09/2017   PLT 176 01/09/2017   NEUTROABS 2.9 01/09/2017    ASSESSMENT & PLAN:  Hemochromatosis, 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 I reviewed her blood work which showed that she had a ferritin of 243. Iron saturation was 99%.  Treatment: Phlebotomy weekly 4 started 09/11/2016-10/03/16, and every 4 weeks  Lab work review 01/09/2017: CBC 12.6 hemoglobin, iron saturation 35%, ferritin 69 One month ago patient's ferritin was 59 and iron saturation was 80%.  Goal to keep ferritin below 100 and iron saturation below 50% Today's labs are pending. I recommended that she undergo phlebotomy once every 2 months. I will see her back in 6 months and then decide the frequency of phlebotomy.  I spent 15 minutes talking to the patient of which more than half was spent in counseling and coordination of care.  No orders of the defined types were placed in this encounter.  The patient has a good understanding of the overall plan. she agrees with it. she will call with any problems that may develop before the next visit here.   Sabas SousGudena, Ayianna Darnold K, MD 01/09/17

## 2017-01-09 NOTE — Assessment & Plan Note (Signed)
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 I reviewed her blood work which showed that she had a ferritin of 243. Iron saturation was 99%.  Treatment: Phlebotomy weekly 4 started 09/11/2016-10/03/16, and every 4 weeks  Lab work review 01/09/2017: Goal to keep ferritin below 100 and iron saturation below 50%

## 2017-01-16 ENCOUNTER — Telehealth: Payer: Self-pay

## 2017-01-16 NOTE — Telephone Encounter (Signed)
Spoke with pt today regarding her phlebotomy. Pt is now scheduled phlebotomy once every 2 months. Pt feeling great and reviewed her labs from last week. Pt prefers not to get phlebotomized today and would like to wait until her next scheduled phlebotomy in august. Notified Dr.Gudena and is aware. Cancelled appt today.

## 2017-03-20 ENCOUNTER — Ambulatory Visit (HOSPITAL_BASED_OUTPATIENT_CLINIC_OR_DEPARTMENT_OTHER): Payer: BLUE CROSS/BLUE SHIELD

## 2017-03-20 DIAGNOSIS — R42 Dizziness and giddiness: Secondary | ICD-10-CM | POA: Diagnosis not present

## 2017-03-20 MED ORDER — SODIUM CHLORIDE 0.9 % IV SOLN
Freq: Once | INTRAVENOUS | Status: AC
  Administered 2017-03-20: 12:00:00 via INTRAVENOUS

## 2017-03-20 NOTE — Patient Instructions (Signed)

## 2017-03-20 NOTE — Progress Notes (Signed)
Samantha LeanSarah [name] presents today for phlebotomy per MD orders. Phlebotomy procedure started at 1050 and ended at 1100.  500 grams removed via R antecubital using 18 gauge angiocath, empty bag and secondary tubing.  1058: Pt reported feeling "lightheaded" and had "muffled hearing". Rate slowed with roller clamp. Chair reclined, cool washcloth applied to forehead. Phlebotomy complete at 1100. Patient observed for 30 minutes after procedure. Pt orthostatic after observation period. Reported "muffled hearing" again.  Normal saline started via R antecubital site which was capped after phlebotomy.  BP improved after 1L normal saline (per order of Dr. Arbutus PedMohamed) Pt reported feeling "better". Pt instructed to change positions slowly, push PO fluids and call office with any issues. She voiced understanding.

## 2017-03-26 ENCOUNTER — Ambulatory Visit (INDEPENDENT_AMBULATORY_CARE_PROVIDER_SITE_OTHER): Payer: BLUE CROSS/BLUE SHIELD

## 2017-03-26 ENCOUNTER — Encounter (INDEPENDENT_AMBULATORY_CARE_PROVIDER_SITE_OTHER): Payer: Self-pay | Admitting: Physician Assistant

## 2017-03-26 ENCOUNTER — Ambulatory Visit (INDEPENDENT_AMBULATORY_CARE_PROVIDER_SITE_OTHER): Payer: BLUE CROSS/BLUE SHIELD | Admitting: Physician Assistant

## 2017-03-26 VITALS — Ht 71.0 in | Wt 182.0 lb

## 2017-03-26 DIAGNOSIS — R200 Anesthesia of skin: Secondary | ICD-10-CM | POA: Diagnosis not present

## 2017-03-26 MED ORDER — METHYLPREDNISOLONE 4 MG PO TABS: ORAL_TABLET | ORAL | 0 refills | Status: DC

## 2017-03-26 MED ORDER — METHOCARBAMOL 500 MG PO TABS: 500.0000 mg | ORAL_TABLET | Freq: Three times a day (TID) | ORAL | 0 refills | Status: DC | PRN

## 2017-03-26 NOTE — Progress Notes (Signed)
Office Visit Note   Patient: Samantha [name]           Date of Birth: [birth year]           MRN: [id number] Visit Date: 03/26/2017              Requested by: Farris Has, MD [address] [place], [place] [address] PCP: Farris Has, MD   Assessment & Plan: Visit Diagnoses:  1. Bilateral leg numbness     Plan: We'll place her on a Medrol Dosepak no NSAIDs while on the Medrol Dosepak. Also placed her on Robaxin. Moist heat low back. Back exercises given and reviewed with patient. We'll see her in 2 weeks' check progress or lack of. Pain or radicular symptoms continued down the legs obtain an MRI to rule out HNP as the source .  Follow-Up Instructions: Return in about 2 weeks (around 04/09/2017).   Orders:  Orders Placed This Encounter  Procedures  . XR Lumbar Spine 2-3 Views   Meds ordered this encounter  Medications  . methocarbamol (ROBAXIN) 500 MG tablet    Sig: Take 1 tablet (500 mg total) by mouth every 8 (eight) hours as needed for muscle spasms.    Dispense:  30 tablet    Refill:  0  . methylPREDNISolone (MEDROL) 4 MG tablet    Sig: Take dose pak as directed    Dispense:  21 tablet    Refill:  0      Procedures: No procedures performed   Clinical Data: No additional findings.   Subjective: Chief Complaint  Patient presents with  . Leg Pain    bilateral leg pain with numbness and tingling    HPI Samantha [name] is a 26 year old female who began having numbness in both legs over the last 2 weeks. She states began the radiates up her legs. She has pain in both legs with weightbearing. 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 Pain is better when she is lying down. She does have some low back pain. She's tried some ibuprofen without any real relief. She denies any injury low back. Denies bowel or bladder dysfunction. Denies increased thirst or increased urination. No awaking pain. She is also  tried some potassium without any significant relief. Review of Systems Negative for chest pain shortness breath fevers chills bowel bladder dysfunction increased thirst or increased urination. Please see history of present illness otherwise  Objective: Vital Signs: Ht 5\' 11"  (1.803 m)   Wt 182 lb (82.6 kg)   BMI 25.38 kg/m   Physical Exam  Constitutional: She is oriented to person, place, and time. She appears well-developed and well-nourished. No distress.  Cardiovascular: Intact distal pulses.   Pulmonary/Chest: Effort normal.  Neurological: She is alert and oriented to person, place, and time.  Skin: She is not diaphoretic.  Psychiatric: She has a normal mood and affect. Her behavior is normal.    Ortho Exam Bilateral lower extremities she has subjective decreased sensation throughout the lower extremities. Dorsal pedal pulses are 2+ and equal and symmetric. There is no rashes skin lesions ulcerations bilateral lower she has 5 out of 5 strengths throughout motion is against resistance. Deep tendon reflexes are 2+ at knees and 1+ at the ankles and equal and symmetric. She has positive straight leg raise bilaterally. Tenderness paraspinous region of the lower lumbar spine bilaterally.Marland Kitchen Specialty Comments:  No specialty comments available.  Imaging: Xr Lumbar Spine 2-3  Views  Result Date: 03/26/2017 Lumbar spine AP lateral views: Disc space well-maintained. No acute fracture. No spinal listhesis. No bony abnormalities.    PMFS History: Patient Active Problem List   Diagnosis Date Noted  . Hemochromatosis, hereditary (HCC) 09/11/2016  . Seizures (HCC) 07/03/2016   Past Medical History:  Diagnosis Date  . Anxiety   . Depression   . Environmental allergies   . Giardia   . Hemochromatosis   . Menorrhagia     Family History  Problem Relation Age of Onset  . Anxiety disorder Mother   . Depression Mother   . Asthma Father   . Psoriasis Father   . Hyperlipidemia Father     . Gout Father     Past Surgical History:  Procedure Laterality Date  . COSMETIC SURGERY     Removal of birthmark on face.  Marland Kitchen FOOT SURGERY Left    Social History   Occupational History  . Customer Service    Social History Main Topics  . Smoking status: Never Smoker  . Smokeless tobacco: Never Used  . Alcohol use Yes     Comment: daily (beer or two a day)  . Drug use: Yes    Types: Marijuana     Comment: Occasional use.  Marland Kitchen Sexual activity: Not on file

## 2017-03-28 ENCOUNTER — Telehealth (INDEPENDENT_AMBULATORY_CARE_PROVIDER_SITE_OTHER): Payer: Self-pay

## 2017-03-28 MED ORDER — BACLOFEN 10 MG PO TABS: 10.0000 mg | ORAL_TABLET | Freq: Two times a day (BID) | ORAL | 0 refills | Status: DC

## 2017-03-28 NOTE — Telephone Encounter (Signed)
Received fax from [facility] asking for change in medication for patient. The robaxin that was prescribed is on back order. Rx changed and submitted to pharmacy.

## 2017-04-04 ENCOUNTER — Ambulatory Visit (INDEPENDENT_AMBULATORY_CARE_PROVIDER_SITE_OTHER): Payer: BLUE CROSS/BLUE SHIELD | Admitting: Orthopaedic Surgery

## 2017-04-11 ENCOUNTER — Ambulatory Visit (INDEPENDENT_AMBULATORY_CARE_PROVIDER_SITE_OTHER): Payer: BLUE CROSS/BLUE SHIELD | Admitting: Orthopaedic Surgery

## 2017-04-24 ENCOUNTER — Other Ambulatory Visit (INDEPENDENT_AMBULATORY_CARE_PROVIDER_SITE_OTHER): Payer: Self-pay

## 2017-04-24 ENCOUNTER — Ambulatory Visit (INDEPENDENT_AMBULATORY_CARE_PROVIDER_SITE_OTHER): Payer: BLUE CROSS/BLUE SHIELD | Admitting: Orthopaedic Surgery

## 2017-04-24 DIAGNOSIS — R202 Paresthesia of skin: Secondary | ICD-10-CM | POA: Diagnosis not present

## 2017-04-24 DIAGNOSIS — R200 Anesthesia of skin: Secondary | ICD-10-CM | POA: Diagnosis not present

## 2017-04-24 NOTE — Progress Notes (Signed)
The patient is someone we saw last month for what was felt to be potentially radicular symptoms related to potentially her lumbar spine. However lumbar spine films were normal and her symptoms of now changed. She reports numbness in both legs in a stocking glove distribution from her knees down her feet. She also has numbness globally in both hands going up to her elbows. This is certainly concerning for potentially neurologic disorder. She has been seen by Dr. Terrace Arabia, a neurologist here in [place]. This was for a seizure she had last year. She last saw the neurologist in follow-up in May and was released. She also has hemochromatosis. Her description does not fit anything from an anatomic distribution in regards to her spine thus far and is been no change in bowel or bladder function. However this numbness is been worsening over the last several weeks.  On exam I see no atrophy of the upper and lower extremities. She does have weak grip strength bilaterally but good strength in her lower extremities. She has subjective numbness globally in both hands and feet.  At this point I do feel that she needs a follow-up appointment with her neurologist again to rule out a neurologic issue that may be creating the signs and symptoms. From an orthopedic standpoint I do not feel that this is a spine issue and it does not seem to be consistent with carpal tunnel syndrome or cubital tunnel syndrome the way she describes her signs and symptoms and her physical exam findings. We will work on making that referral of also encouraged her to call their office as well. All questions were addressed.

## 2017-04-27 ENCOUNTER — Telehealth: Payer: Self-pay | Admitting: Neurology

## 2017-04-27 NOTE — Telephone Encounter (Signed)
You last saw this patient May 2018 and we have received another referral for her. I called her to schedule an appt and she wants to switch to another doctor. Is it ok to switch and who should I put her with ? Thanks dg

## 2017-04-27 NOTE — Telephone Encounter (Signed)
It is Ok to switch 

## 2017-05-01 ENCOUNTER — Telehealth: Payer: Self-pay | Admitting: Neurology

## 2017-05-01 NOTE — Telephone Encounter (Signed)
This patient was seen by Dr Terrace Arabia last May 2018. We have received another referral for this patient and she does not want to see Dr Terrace Arabia. Dr Terrace Arabia said it was ok to switch. Would it be ok to put this patient with you? Please advise. Thanks Diane

## 2017-05-01 NOTE — Telephone Encounter (Signed)
Okay with me for the switch. 

## 2017-05-18 ENCOUNTER — Other Ambulatory Visit: Payer: Self-pay | Admitting: *Deleted

## 2017-05-21 ENCOUNTER — Telehealth: Payer: Self-pay | Admitting: Hematology and Oncology

## 2017-05-21 ENCOUNTER — Ambulatory Visit (HOSPITAL_BASED_OUTPATIENT_CLINIC_OR_DEPARTMENT_OTHER): Payer: BLUE CROSS/BLUE SHIELD | Admitting: Hematology and Oncology

## 2017-05-21 ENCOUNTER — Other Ambulatory Visit (HOSPITAL_BASED_OUTPATIENT_CLINIC_OR_DEPARTMENT_OTHER): Payer: BLUE CROSS/BLUE SHIELD

## 2017-05-21 DIAGNOSIS — D72819 Decreased white blood cell count, unspecified: Secondary | ICD-10-CM

## 2017-05-21 DIAGNOSIS — R569 Unspecified convulsions: Secondary | ICD-10-CM | POA: Diagnosis not present

## 2017-05-21 DIAGNOSIS — R748 Abnormal levels of other serum enzymes: Secondary | ICD-10-CM | POA: Diagnosis not present

## 2017-05-21 LAB — CBC WITH DIFFERENTIAL/PLATELET
BASO%: 1.5 % (ref 0.0–2.0)
Basophils Absolute: 0 10*3/uL (ref 0.0–0.1)
EOS ABS: 0.1 10*3/uL (ref 0.0–0.5)
EOS%: 4.9 % (ref 0.0–7.0)
HEMATOCRIT: 37.3 % (ref 34.8–46.6)
HEMOGLOBIN: 12.3 g/dL (ref 11.6–15.9)
LYMPH#: 1.2 10*3/uL (ref 0.9–3.3)
LYMPH%: 42.3 % (ref 14.0–49.7)
MCH: 32.9 pg (ref 25.1–34.0)
MCHC: 32.9 g/dL (ref 31.5–36.0)
MCV: 100.1 fL (ref 79.5–101.0)
MONO#: 0.3 10*3/uL (ref 0.1–0.9)
MONO%: 9.9 % (ref 0.0–14.0)
NEUT%: 41.4 % (ref 38.4–76.8)
NEUTROS ABS: 1.2 10*3/uL — AB (ref 1.5–6.5)
PLATELETS: 189 10*3/uL (ref 145–400)
RBC: 3.73 10*6/uL (ref 3.70–5.45)
RDW: 15 % — AB (ref 11.2–14.5)
WBC: 2.8 10*3/uL — AB (ref 3.9–10.3)

## 2017-05-21 LAB — IRON AND TIBC
%SAT: 12 % — ABNORMAL LOW (ref 21–57)
Iron: 30 ug/dL — ABNORMAL LOW (ref 41–142)
TIBC: 250 ug/dL (ref 236–444)
UIBC: 220 ug/dL (ref 120–384)

## 2017-05-21 LAB — FERRITIN: Ferritin: 66 ng/ml (ref 9–269)

## 2017-05-21 NOTE — Progress Notes (Signed)
Patient Care Team: Farris Has, MD as PCP - General (Family Medicine)  DIAGNOSIS:  Encounter Diagnosis  Name Primary?  . Hemochromatosis, hereditary (HCC)    CHIEF COMPLIANT: follow-up of hereditary hemochromatosis  INTERVAL HISTORY: Samantha [name] is a 26 year old with above-mentioned history of hereditary hemochromatosis was being on phlebotomy treatments previously and has not had any phlebotomy since August 2018. She is here today to review her blood work. She had CBC and iron studies done today. Iron studies are pending. CBC reveals a new finding of low white blood cell count but rest of the blood parameters appear to be stable.  REVIEW OF SYSTEMS:   Constitutional: Denies fevers, chills or abnormal weight loss Eyes: Denies blurriness of vision Ears, nose, mouth, throat, and face: Denies mucositis or sore throat Respiratory: Denies cough, dyspnea or wheezes Cardiovascular: Denies palpitation, chest discomfort Gastrointestinal:  Denies nausea, heartburn or change in bowel habits Skin: Denies abnormal skin rashes Lymphatics: Denies new lymphadenopathy or easy bruising Neurological:Denies numbness, tingling or new weaknesses Behavioral/Psych: Mood is stable, no new changes  Extremities: No lower extremity edema All other systems were reviewed with the patient and are negative.  I have reviewed the past medical history, past surgical history, social history and family history with the patient and they are unchanged from previous note.  ALLERGIES:  is allergic to sulfa antibiotics.  MEDICATIONS:  Current Outpatient Prescriptions  Medication Sig Dispense Refill  . clonazePAM (KLONOPIN) 0.5 MG tablet Take 0.5 mg by mouth as needed for anxiety.    . Cyanocobalamin (VITAMIN B-12 PO) Take 1 tablet by mouth daily.    . folic acid (FOLVITE) 1 MG tablet Take 1 mg by mouth daily.    Marland Kitchen ibuprofen (ADVIL,MOTRIN) 200 MG tablet Take 600 mg by mouth every 6 (six) hours as needed for moderate  pain.    Marland Kitchen levonorgestrel-ethinyl estradiol (SEASONALE,INTROVALE,JOLESSA) 0.15-0.03 MG tablet Take 1 tablet by mouth daily.  4  . MAGNESIUM PO Take 1 tablet by mouth daily.    . metoCLOPramide (REGLAN) 10 MG tablet Take 1 tablet (10 mg total) by mouth every 6 (six) hours. 30 tablet 0  . ondansetron (ZOFRAN) 4 MG tablet Take 1 tablet (4 mg total) by mouth every 6 (six) hours. (Patient taking differently: Take 4 mg by mouth every 8 (eight) hours as needed for nausea or vomiting. ) 12 tablet 0   No current facility-administered medications for this visit.     PHYSICAL EXAMINATION: ECOG PERFORMANCE STATUS: 1 - Symptomatic but completely ambulatory  Vitals:   05/21/17 1016  BP: 123/79  Pulse: 67  Resp: 20  Temp: 98.2 F (36.8 C)  SpO2: 100%   Filed Weights   05/21/17 1016  Weight: 177 lb 8 oz (80.5 kg)    GENERAL:alert, no distress and comfortable SKIN: skin color, texture, turgor are normal, no rashes or significant lesions EYES: normal, Conjunctiva are pink and non-injected, sclera clear OROPHARYNX:no exudate, no erythema and lips, buccal mucosa, and tongue normal  NECK: supple, thyroid normal size, non-tender, without nodularity LYMPH:  no palpable lymphadenopathy in the cervical, axillary or inguinal LUNGS: clear to auscultation and percussion with normal breathing effort HEART: regular rate & rhythm and no murmurs and no lower extremity edema ABDOMEN:abdomen soft, non-tender and normal bowel sounds MUSCULOSKELETAL:no cyanosis of digits and no clubbing  NEURO: alert & oriented x 3 with fluent speech, no focal motor/sensory deficits EXTREMITIES: No lower extremity edema  LABORATORY DATA:  I have reviewed the data as listed  Chemistry      Component Value Date/Time   NA 136 12/18/2016 2000   NA 140 10/09/2016 1209   K 4.2 12/18/2016 2000   K 4.1 10/09/2016 1209   CL 104 12/18/2016 2000   CO2 21 (L) 12/18/2016 2000   CO2 21 (L) 10/09/2016 1209   BUN <5 (L) 12/18/2016  2000   BUN 7.8 10/09/2016 1209   CREATININE 0.98 12/18/2016 2000   CREATININE 0.8 10/09/2016 1209      Component Value Date/Time   CALCIUM 9.1 12/18/2016 2000   CALCIUM 9.0 10/09/2016 1209   ALKPHOS 37 (L) 12/18/2016 2000   ALKPHOS 39 (L) 10/09/2016 1209   AST 93 (H) 12/18/2016 2000   AST 45 (H) 10/09/2016 1209   ALT 32 12/18/2016 2000   ALT 22 10/09/2016 1209   BILITOT 1.8 (H) 12/18/2016 2000   BILITOT 0.37 10/09/2016 1209       Lab Results  Component Value Date   WBC 2.8 (L) 05/21/2017   HGB 12.3 05/21/2017   HCT 37.3 05/21/2017   MCV 100.1 05/21/2017   PLT 189 05/21/2017   NEUTROABS 1.2 (L) 05/21/2017    ASSESSMENT & PLAN:  Hemochromatosis, 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 I reviewed her blood work which showed that she had a ferritin of 243. Iron saturation was 99%.  Treatment: Phlebotomy weekly 4 started 09/11/2016-10/03/16, Now she is getting phlebotomy every 6 months most recent phlebotomy was in August 2018  Lab work review:  WBC count 2.8 ANC 1.2 Hemoglobin is 12.3 Awaiting the results of iron studies to decide if she needs phlebotomy.  Leukopenia: This is unclear etiology. There is been no change in her medications and she does not have any illnesses. I would like to just repeat her CBC in 2 weeks and go over the results with her. When she comes back in 2 weeks I will just see her for a brief time without actually be scheduling an appointment.  Goal to keep ferritin below 100 and iron saturation below 50% We will see her back in 3 months with labs and follow-up and phlebotomy if needed.  I spent 25 minutes talking to the patient of which more than half was spent in counseling and coordination of care.  Orders Placed This Encounter  Procedures  . CBC with Differential    Standing Status:   Future    Standing Expiration Date:    05/21/2018  . CBC with Differential    Standing Status:   Future    Standing Expiration Date:   05/21/2018  . Ferritin    Standing Status:   Future    Standing Expiration Date:   05/21/2018  . Iron and TIBC    Standing Status:   Future    Standing Expiration Date:   05/21/2018   The patient has a good understanding of the overall plan. she agrees with it. she will call with any problems that may develop before the next visit here.   Sabas Sous, MD 05/21/17

## 2017-05-21 NOTE — Assessment & Plan Note (Signed)
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 I reviewed her blood work which showed that she had a ferritin of 243. Iron saturation was 99%.  Treatment: Phlebotomy weekly 4 started 09/11/2016-10/03/16, and every 4 weeks  Lab work review:   Goal to keep ferritin below 100 and iron saturation below 50%

## 2017-05-21 NOTE — Telephone Encounter (Signed)
Gave patient avs and calendar with appts per 10/15 los.  °

## 2017-06-04 ENCOUNTER — Other Ambulatory Visit: Payer: BLUE CROSS/BLUE SHIELD

## 2017-07-05 ENCOUNTER — Ambulatory Visit: Payer: BLUE CROSS/BLUE SHIELD | Admitting: Neurology

## 2017-07-06 ENCOUNTER — Other Ambulatory Visit: Payer: Self-pay

## 2017-07-06 ENCOUNTER — Ambulatory Visit (INDEPENDENT_AMBULATORY_CARE_PROVIDER_SITE_OTHER): Payer: BLUE CROSS/BLUE SHIELD | Admitting: Neurology

## 2017-07-06 ENCOUNTER — Encounter: Payer: Self-pay | Admitting: Neurology

## 2017-07-06 VITALS — BP 115/74 | HR 81 | Ht 71.0 in | Wt 171.5 lb

## 2017-07-06 DIAGNOSIS — R569 Unspecified convulsions: Secondary | ICD-10-CM | POA: Diagnosis not present

## 2017-07-06 DIAGNOSIS — R202 Paresthesia of skin: Secondary | ICD-10-CM

## 2017-07-06 NOTE — Patient Instructions (Signed)
   We will get EMG and NCV evaluation to look at nerve function.

## 2017-07-06 NOTE — Progress Notes (Signed)
Reason for visit: Paresthesias all 4 extremities  Referring physician: Dr. Thomasene Lot [name] is a 26 y.o. female  History of present illness:  Samantha [name] is a 26 year old right-handed white female with a history of a single seizure event that occurred on 19 June 2016.  The patient has been seen previously by Dr. Terrace Arabia, a workup revealed a relatively unremarkable MRI of the brain and a normal EEG study.  The patient has not been placed on medications.  The patient has not had any recurrence of seizures.  She returns to this office with a new problem.  She has had onset of sensory changes in the arms and legs that began in the summer 2018.  The numbness initially started in the left foot, and then eventually spread to the right foot and then the numbness spread gradually up to the hip level bilaterally.  The symptoms evolved over 1 month, and have remained stable since that time.  Within 2 months of onset of the numbness in the feet, the patient began having numbness in the hands that has gone up to the elbow level, but more recently the numbness has improved in the arms and has gone down to the finger level.  The patient has noted no true weakness, but she does have some mild gait instability, she has fallen on one occasion.  The patient denies any problems controlling the bowels with the bladder.  She does have some low back pain, she denies any neck pain.  The patient is not having further seizure events.  She does have a history of hemochromatosis and she is being followed for this.  The patient will report some cramping in the legs at times.  The patient denies any increased symptoms down the arms or legs with neck flexion or extension.  She is sent to this office for an evaluation.  Past Medical History:  Diagnosis Date  . Anxiety   . Depression   . Environmental allergies   . Giardia   . Hemochromatosis   . Menorrhagia     Past Surgical History:  Procedure Laterality Date  .  COSMETIC SURGERY     Removal of birthmark on face.  Marland Kitchen FOOT SURGERY Left     Family History  Problem Relation Age of Onset  . Anxiety disorder Mother   . Depression Mother   . Asthma Father   . Psoriasis Father   . Hyperlipidemia Father   . Gout Father     Social history:  reports that  has never smoked. she has never used smokeless tobacco. She reports that she drinks alcohol. She reports that she uses drugs. Drug: Marijuana.  Medications:  Prior to Admission medications   Medication Sig Start Date End Date Taking? Authorizing Provider  clonazePAM (KLONOPIN) 0.5 MG tablet Take 0.5 mg by mouth as needed for anxiety.    [provider]  Cyanocobalamin (VITAMIN B-12 PO) Take 1 tablet by mouth daily.    [provider]  folic acid (FOLVITE) 1 MG tablet Take 1 mg by mouth daily.    [provider]  ibuprofen (ADVIL,MOTRIN) 200 MG tablet Take 600 mg by mouth every 6 (six) hours as needed for moderate pain.    [provider]  levonorgestrel-ethinyl estradiol (SEASONALE,INTROVALE,JOLESSA) 0.15-0.03 MG tablet Take 1 tablet by mouth daily. 12/26/14   [provider]  MAGNESIUM PO Take 1 tablet by mouth daily.    [provider]  metoCLOPramide (REGLAN) 10 MG tablet Take  1 tablet (10 mg total) by mouth every 6 (six) hours. 12/19/16   Earley FavorSchulz, Gail, NP  ondansetron (ZOFRAN) 4 MG tablet Take 1 tablet (4 mg total) by mouth every 6 (six) hours. Patient taking differently: Take 4 mg by mouth every 8 (eight) hours as needed for nausea or vomiting.  02/07/15   Mabe, Latanya MaudlinMartha L, MD      Allergies  Allergen Reactions  . Sulfa Antibiotics Hives and Rash    ROS:  Out of a complete 14 system review of symptoms, the patient complains only of the following symptoms, and all other reviewed systems are negative.  Weight loss Muscle cramps, aching muscles Allergies, runny nose Headache, numbness, weakness, dizziness, seizure Depression, anxiety, none  of sleep, decreased energy, change in appetite Insomnia, restless legs  Weight 171 lb 8 oz (77.8 kg).  Physical Exam  General: The patient is alert and cooperative at the time of the examination.  Eyes: Pupils are equal, round, and reactive to light. Discs are flat bilaterally.  Neck: The neck is supple, no carotid bruits are noted.  Respiratory: The respiratory examination is clear.  Cardiovascular: The cardiovascular examination reveals a regular rate and rhythm, no obvious murmurs or rubs are noted.  Neuromuscular: Range of movement the cervical spine and lumbar spine are full and normal.  Skin: Extremities are without significant edema.  Neurologic Exam  Mental status: The patient is alert and oriented x 3 at the time of the examination. The patient has apparent normal recent and remote memory, with an apparently normal attention span and concentration ability.  Cranial nerves: Facial symmetry is present. There is good sensation of the face to pinprick and soft touch bilaterally. The strength of the facial muscles and the muscles to head turning and shoulder shrug are normal bilaterally. Speech is well enunciated, no aphasia or dysarthria is noted. Extraocular movements are full. Visual fields are full. The tongue is midline, and the patient has symmetric elevation of the soft palate. No obvious hearing deficits are noted.  Motor: The motor testing reveals 5 over 5 strength of all 4 extremities. Good symmetric motor tone is noted throughout.  Sensory: Sensory testing is intact to pinprick, soft touch, vibration sensation, and position sense on all 4 extremities. No evidence of extinction is noted.  Coordination: Cerebellar testing reveals good finger-nose-finger and heel-to-shin bilaterally.  Gait and station: Gait is normal. Tandem gait is normal. Romberg is negative. No drift is seen.  The patient is able to walk on heels and the toes bilaterally.  Reflexes: Deep tendon  reflexes are symmetric, but are absent bilaterally. Toes are downgoing bilaterally.   MRI brain 07/09/16:  IMPRESSION:  This MRI of the brain with and without contrast shows the following: 1.    Multiple T2/FLAIR hyperintense foci in the subcortical and deep white matter. The changes are nonspecific and could represent microvascular ischemic changes (unexpected at this age) or be the sequela of prior inflammation or infection.  Demyelination is less likely.   None of the foci appears to be acute.  2.   Chronic maxillary, sphenoid and ethmoid sinusitis. 3.    There are no acute findings.  * MRI scan images were reviewed online. I agree with the written report.    Assessment/Plan:  1.  Paresthesias, numbness, all 4 extremities  2.  Hemochromatosis  3.  History of single seizure event  The patient presents with sensory alteration on all 4 extremities.  Reflexes are depressed to absent throughout.  Prior notes  from Dr. Terrace ArabiaYan indicate normal reflexes up to May 2018.  A neuropathic process such as CIDP should be considered, therefore.  The patient will be sent for blood work today.  She will have nerve conduction studies done on all 4 extremities, she will have EMG evaluation on one leg.  She will follow-up in 4 months.  Samantha Palau. Keith Future Yeldell MD 07/06/2017 9:33 AM  [facility] [address] [place], [place] [id number]  Phone [phone] Fax [phone]

## 2017-07-10 LAB — MULTIPLE MYELOMA PANEL, SERUM
ALPHA 1: 0.2 g/dL (ref 0.0–0.4)
Albumin SerPl Elph-Mcnc: 4 g/dL (ref 2.9–4.4)
Albumin/Glob SerPl: 1.5 (ref 0.7–1.7)
Alpha2 Glob SerPl Elph-Mcnc: 0.5 g/dL (ref 0.4–1.0)
B-Globulin SerPl Elph-Mcnc: 1.1 g/dL (ref 0.7–1.3)
Gamma Glob SerPl Elph-Mcnc: 0.9 g/dL (ref 0.4–1.8)
Globulin, Total: 2.8 g/dL (ref 2.2–3.9)
IGG (IMMUNOGLOBIN G), SERUM: 1013 mg/dL (ref 700–1600)
IGM (IMMUNOGLOBULIN M), SRM: 91 mg/dL (ref 26–217)
IgA/Immunoglobulin A, Serum: 366 mg/dL — ABNORMAL HIGH (ref 87–352)
Total Protein: 6.8 g/dL (ref 6.0–8.5)

## 2017-07-10 LAB — ANGIOTENSIN CONVERTING ENZYME: Angio Convert Enzyme: 54 U/L (ref 14–82)

## 2017-07-10 LAB — AMMONIA: AMMONIA: 85 ug/dL (ref 19–87)

## 2017-07-10 LAB — ANA W/REFLEX: Anti Nuclear Antibody(ANA): NEGATIVE

## 2017-07-10 LAB — COPPER, SERUM: COPPER: 84 ug/dL (ref 72–166)

## 2017-07-10 LAB — B. BURGDORFI ANTIBODIES

## 2017-07-10 LAB — SEDIMENTATION RATE: SED RATE: 4 mm/h (ref 0–32)

## 2017-07-10 LAB — HIV ANTIBODY (ROUTINE TESTING W REFLEX): HIV Screen 4th Generation wRfx: NONREACTIVE

## 2017-07-10 LAB — VITAMIN B12: VITAMIN B 12: 879 pg/mL (ref 232–1245)

## 2017-07-11 ENCOUNTER — Ambulatory Visit (INDEPENDENT_AMBULATORY_CARE_PROVIDER_SITE_OTHER): Payer: BLUE CROSS/BLUE SHIELD | Admitting: Neurology

## 2017-07-11 ENCOUNTER — Encounter: Payer: Self-pay | Admitting: Neurology

## 2017-07-11 ENCOUNTER — Telehealth: Payer: Self-pay | Admitting: *Deleted

## 2017-07-11 DIAGNOSIS — R202 Paresthesia of skin: Secondary | ICD-10-CM | POA: Diagnosis not present

## 2017-07-11 DIAGNOSIS — R200 Anesthesia of skin: Secondary | ICD-10-CM

## 2017-07-11 NOTE — Procedures (Signed)
     HISTORY:  Samantha [name] is a 26 year old patient with a history of onset of numbness involving all 4 extremities that began in the summer 2018.  The patient has been noted to have decreased reflexes on all 4 extremities.  She is being evaluated for possible demyelinating neuropathy.  NERVE CONDUCTION STUDIES:  Nerve conduction studies were performed on both upper extremities. The distal motor latencies and motor amplitudes for the median and ulnar nerves were within normal limits. The F wave latencies and nerve conduction velocities for these nerves were also normal. The sensory latencies for the median and ulnar nerves were normal.  Nerve conduction studies were performed on both lower extremities.  The distal motor latencies for the peroneal nerves were slightly prolonged on the left and normal on the right with low motor amplitudes for these nerves bilaterally.  The distal motor latencies and motor amplitudes for the posterior tibial nerves were normal bilaterally.  Nerve conduction velocities for the peroneal and posterior tibial nerves were within normal limits bilaterally.  The sensory latencies for the peroneal nerves were normal bilaterally, and the H reflex latencies were slightly prolonged on the left, normal on the right.  EMG STUDIES:  EMG study was performed on the right lower extremity:  The tibialis anterior muscle reveals 2 to 4K motor units with full recruitment. No fibrillations or positive waves were seen. The peroneus tertius muscle reveals 2 to 4K motor units with full recruitment. No fibrillations or positive waves were seen. The medial gastrocnemius muscle reveals 1 to 3K motor units with full recruitment. No fibrillations or positive waves were seen. The vastus lateralis muscle reveals 2 to 4K motor units with full recruitment. No fibrillations or positive waves were seen. The iliopsoas muscle reveals 2 to 4K motor units with full recruitment. No fibrillations or  positive waves were seen. The biceps femoris muscle (long head) reveals 2 to 4K motor units with full recruitment. No fibrillations or positive waves were seen. The lumbosacral paraspinal muscles were tested at 3 levels, and revealed no abnormalities of insertional activity at all 3 levels tested. There was good relaxation.   IMPRESSION:  Nerve conduction studies done on both lower extremities and both upper extremities are relatively unremarkable with exception of evidence of slight distal dysfunction of the peroneal nerves bilaterally.  There is no evidence of a generalized demyelinating neuropathy.  The EMG study of the right lower extremity was unremarkable, no evidence of a lumbosacral radiculopathy was seen.  Marlan Palau. Keith Willis MD 07/11/2017 2:59 PM  [facility] [address] [place], [place] [id number]  Phone [phone] Fax [phone]

## 2017-07-11 NOTE — Progress Notes (Signed)
Please refer to EMG and nerve conduction study procedure note. 

## 2017-07-11 NOTE — Progress Notes (Signed)
The patient comes in today for EMG and nerve conduction study evaluation.  The nerve conduction studies do not confirm presence of a demyelinating neuropathy that would suggest CIDP.  EMG of the right leg was unremarkable.  Given the ongoing symptoms of numbness of all 4 extremities, the patient will undergo MRI of the brain and cervical spine with and without gadolinium enhancement.  The purpose of the studies is to rule out a demyelinating disease.

## 2017-07-11 NOTE — Telephone Encounter (Signed)
Called and spoke with pt. Advised labs unremarkable per CW,MD.  Reminded her of EMG/NCS appt today at 1:30pm, check in 1:00pm. She verbalized understanding.

## 2017-07-11 NOTE — Telephone Encounter (Signed)
-----   Message from York Spanielharles K Willis, MD sent at 07/10/2017  5:09 PM EST -----  The blood work results are unremarkable. Please call the patient.  ----- Message ----- From: Nell RangeInterface, [facility] Lab Results In Sent: 07/07/2017   7:41 AM To: York Spanielharles K Willis, MD

## 2017-07-12 ENCOUNTER — Telehealth: Payer: Self-pay | Admitting: Radiology

## 2017-07-12 NOTE — Telephone Encounter (Signed)
Spoke to the patient she is schedule to have her MRI done on 07/18/17 at [facility].

## 2017-07-12 NOTE — Telephone Encounter (Signed)
Pt returned Emily's call °

## 2017-07-18 ENCOUNTER — Ambulatory Visit (INDEPENDENT_AMBULATORY_CARE_PROVIDER_SITE_OTHER): Payer: BLUE CROSS/BLUE SHIELD

## 2017-07-18 DIAGNOSIS — R202 Paresthesia of skin: Secondary | ICD-10-CM | POA: Diagnosis not present

## 2017-07-18 MED ORDER — GADOPENTETATE DIMEGLUMINE 469.01 MG/ML IV SOLN: 15.0000 mL | Freq: Once | INTRAVENOUS | Status: AC | PRN

## 2017-07-19 ENCOUNTER — Telehealth: Payer: Self-pay | Admitting: Neurology

## 2017-07-19 NOTE — Telephone Encounter (Signed)
  I called the patient.  MRI of the brain shows minimal nonspecific white matter changes, no change from a year ago, MRI of the cervical spine does show a disc herniation, no evidence of severe spinal cord compression or spinal cord injury, no evidence of demyelinating disease.  We will follow the subjective sensory changes over time, prior EMG nerve conduction study did not show evidence of CIDP or any other neuropathy.   MRI brain 07/18/17:  IMPRESSION: Abnormal MRI scan of the brain showing tiny punctate periventricular and subcortical white matter nonspecific hyperintensities with the differential discussed above.  No enhancing lesions are noted.  There are mild chronic changes of paranasal sinus inflammation.  No significant change compared with previous MRI scan dated 07/09/2016.    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  No enhancing lesions are noted.

## 2017-07-27 ENCOUNTER — Other Ambulatory Visit (HOSPITAL_BASED_OUTPATIENT_CLINIC_OR_DEPARTMENT_OTHER): Payer: BLUE CROSS/BLUE SHIELD

## 2017-07-27 ENCOUNTER — Ambulatory Visit: Payer: BLUE CROSS/BLUE SHIELD

## 2017-07-27 LAB — IRON AND TIBC
%SAT: 30 % (ref 21–57)
IRON: 65 ug/dL (ref 41–142)
TIBC: 220 ug/dL — ABNORMAL LOW (ref 236–444)
UIBC: 155 ug/dL (ref 120–384)

## 2017-07-27 LAB — CBC WITH DIFFERENTIAL/PLATELET
BASO%: 2.5 % — ABNORMAL HIGH (ref 0.0–2.0)
BASOS ABS: 0.1 10*3/uL (ref 0.0–0.1)
EOS%: 4.4 % (ref 0.0–7.0)
Eosinophils Absolute: 0.1 10*3/uL (ref 0.0–0.5)
HEMATOCRIT: 39.2 % (ref 34.8–46.6)
HGB: 12.9 g/dL (ref 11.6–15.9)
LYMPH#: 0.9 10*3/uL (ref 0.9–3.3)
LYMPH%: 27.9 % (ref 14.0–49.7)
MCH: 33 pg (ref 25.1–34.0)
MCHC: 33 g/dL (ref 31.5–36.0)
MCV: 100.1 fL (ref 79.5–101.0)
MONO#: 0.4 10*3/uL (ref 0.1–0.9)
MONO%: 12.7 % (ref 0.0–14.0)
NEUT#: 1.7 10*3/uL (ref 1.5–6.5)
NEUT%: 52.5 % (ref 38.4–76.8)
Platelets: 133 10*3/uL — ABNORMAL LOW (ref 145–400)
RBC: 3.91 10*6/uL (ref 3.70–5.45)
RDW: 16.7 % — ABNORMAL HIGH (ref 11.2–14.5)
WBC: 3.2 10*3/uL — ABNORMAL LOW (ref 3.9–10.3)

## 2017-07-27 LAB — FERRITIN: Ferritin: 63 ng/ml (ref 9–269)

## 2017-07-27 NOTE — Progress Notes (Signed)
Pt's last ferretin, iron TBC and CBC were in Oct 2018.  Both ferretin and iron saturation were well below the parameters for treatment.  Pt sent to lab have blood work.  Pt instructed that she will not get a phlebotomy today but will have blood work instead.  Pt will be called tomorrow if needed to scheduled phlebotomy

## 2017-08-20 ENCOUNTER — Other Ambulatory Visit: Payer: BLUE CROSS/BLUE SHIELD

## 2017-08-20 NOTE — Assessment & Plan Note (Deleted)
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 I reviewed her blood work which showed that she had a ferritin of 243. Iron saturation was 99%.  Treatment: Phlebotomy weekly 4 started 09/11/2016-10/03/16, and every 4 weeks  Lab work review:   Goal to keep ferritin below 100 and iron saturation below 50%  RTC in 3 months

## 2017-08-21 ENCOUNTER — Ambulatory Visit: Payer: BLUE CROSS/BLUE SHIELD | Admitting: Hematology and Oncology

## 2017-09-18 ENCOUNTER — Inpatient Hospital Stay: Payer: BLUE CROSS/BLUE SHIELD | Attending: Hematology and Oncology

## 2017-09-18 ENCOUNTER — Telehealth: Payer: Self-pay

## 2017-09-18 LAB — CBC WITH DIFFERENTIAL/PLATELET
BASOS ABS: 0 10*3/uL (ref 0.0–0.1)
BASOS PCT: 1 %
Eosinophils Absolute: 0.3 10*3/uL (ref 0.0–0.5)
Eosinophils Relative: 9 %
HEMATOCRIT: 40.5 % (ref 34.8–46.6)
Hemoglobin: 13.4 g/dL (ref 11.6–15.9)
Lymphocytes Relative: 29 %
Lymphs Abs: 1.1 10*3/uL (ref 0.9–3.3)
MCH: 32.9 pg (ref 25.1–34.0)
MCHC: 33.1 g/dL (ref 31.5–36.0)
MCV: 99.5 fL (ref 79.5–101.0)
MONO ABS: 0.5 10*3/uL (ref 0.1–0.9)
MONOS PCT: 12 %
NEUTROS ABS: 1.8 10*3/uL (ref 1.5–6.5)
Neutrophils Relative %: 49 %
PLATELETS: 115 10*3/uL — AB (ref 145–400)
RBC: 4.07 MIL/uL (ref 3.70–5.45)
RDW: 13.9 % (ref 11.2–14.5)
WBC: 3.6 10*3/uL — ABNORMAL LOW (ref 3.9–10.3)

## 2017-09-18 NOTE — Telephone Encounter (Signed)
Pt had a phlebotomy appointment scheduled without labs and MD follow up. Previous labs were WNL in December. Canceled today's phlebotomy apt and ordered labs. Rescheduled pt to see Dr. Pamelia HoitGudena for follow up and possible phlebotomy next week. Pt aware of appointments.  Minerva Endsiffany N Annjeanette Sarwar RN

## 2017-09-27 ENCOUNTER — Inpatient Hospital Stay (HOSPITAL_BASED_OUTPATIENT_CLINIC_OR_DEPARTMENT_OTHER): Payer: BLUE CROSS/BLUE SHIELD | Admitting: Hematology and Oncology

## 2017-09-27 ENCOUNTER — Telehealth: Payer: Self-pay | Admitting: Hematology and Oncology

## 2017-09-27 ENCOUNTER — Inpatient Hospital Stay: Payer: BLUE CROSS/BLUE SHIELD

## 2017-09-27 NOTE — Progress Notes (Signed)
Patient Care Team: Farris Has, MD as PCP - General (Family Medicine)  DIAGNOSIS:  Encounter Diagnosis  Name Primary?  . Hemochromatosis, hereditary (HCC)     CHIEF COMPLIANT: Follow-up of hemochromatosis  INTERVAL HISTORY: Samantha [name] is a 27 year old with above-mentioned history of hereditary hemochromatosis who is here for a phlebotomy.  She has not had a phlebotomy in the past 6 months.  She reports that she is had chronic problems with gastrointestinal system.  She has had upper endoscopies and colonoscopy which did not reveal a clear-cut cause.  She continues to have nausea vomiting and diarrhea after she eats food.  Because of this she has not been eating food and therefore has lost some weight.  She tells me that her diet is extremely poor.  REVIEW OF SYSTEMS:   Constitutional: Denies fevers, chills or abnormal weight loss Eyes: Denies blurriness of vision Ears, nose, mouth, throat, and face: Denies mucositis or sore throat Respiratory: Denies cough, dyspnea or wheezes Cardiovascular: Denies palpitation, chest discomfort Gastrointestinal: Nausea vomiting and diarrhea Skin: Denies abnormal skin rashes Lymphatics: Denies new lymphadenopathy or easy bruising Neurological:Denies numbness, tingling or new weaknesses Behavioral/Psych: Mood is stable, no new changes  Extremities: No lower extremity edema  All other systems were reviewed with the patient and are negative.  I have reviewed the past medical history, past surgical history, social history and family history with the patient and they are unchanged from previous note.  ALLERGIES:  is allergic to sulfa antibiotics.  MEDICATIONS:  Current Outpatient Medications  Medication Sig Dispense Refill  . clonazePAM (KLONOPIN) 0.5 MG tablet Take 0.5 mg by mouth as needed for anxiety.    . Cyanocobalamin (VITAMIN B-12 PO) Take 1 tablet by mouth daily.    . folic acid (FOLVITE) 1 MG tablet Take 1 mg by mouth daily.    Marland Kitchen  ibuprofen (ADVIL,MOTRIN) 200 MG tablet Take 600 mg by mouth every 6 (six) hours as needed for moderate pain.    Marland Kitchen levonorgestrel-ethinyl estradiol (SEASONALE,INTROVALE,JOLESSA) 0.15-0.03 MG tablet Take 1 tablet by mouth daily.  4  . MAGNESIUM PO Take 1 tablet by mouth daily.    . metoCLOPramide (REGLAN) 10 MG tablet Take 1 tablet (10 mg total) by mouth every 6 (six) hours. 30 tablet 0  . ondansetron (ZOFRAN) 4 MG tablet Take 1 tablet (4 mg total) by mouth every 6 (six) hours. (Patient taking differently: Take 4 mg by mouth every 8 (eight) hours as needed for nausea or vomiting. ) 12 tablet 0   No current facility-administered medications for this visit.    Facility-Administered Medications Ordered in Other Visits  Medication Dose Route Frequency Provider Last Rate Last Dose  . gadopentetate dimeglumine (MAGNEVIST) injection 15 mL  15 mL Intravenous Once PRN York Spaniel, MD        PHYSICAL EXAMINATION: ECOG PERFORMANCE STATUS: 1 - Symptomatic but completely ambulatory  There were no vitals filed for this visit. There were no vitals filed for this visit.  GENERAL:alert, no distress and comfortable SKIN: skin color, texture, turgor are normal, no rashes or significant lesions EYES: normal, Conjunctiva are pink and non-injected, sclera clear OROPHARYNX:no exudate, no erythema and lips, buccal mucosa, and tongue normal  NECK: supple, thyroid normal size, non-tender, without nodularity LYMPH:  no palpable lymphadenopathy in the cervical, axillary or inguinal LUNGS: clear to auscultation and percussion with normal breathing effort HEART: regular rate & rhythm and no murmurs and no lower extremity edema ABDOMEN:abdomen soft, non-tender and normal bowel sounds MUSCULOSKELETAL:no  cyanosis of digits and no clubbing  NEURO: alert & oriented x 3 with fluent speech, no focal motor/sensory deficits EXTREMITIES: No lower extremity edema  LABORATORY DATA:  I have reviewed the data as  listed CMP Latest Ref Rng & Units 07/06/2017 12/18/2016 10/09/2016  Glucose 65 - 99 mg/dL - 97 161104  BUN 6 - 20 mg/dL - <0(R<5(L) 7.8  Creatinine 0.44 - 1.00 mg/dL - 6.040.98 0.8  Sodium 540135 - 145 mmol/L - 136 140  Potassium 3.5 - 5.1 mmol/L - 4.2 4.1  Chloride 101 - 111 mmol/L - 104 -  CO2 22 - 32 mmol/L - 21(L) 21(L)  Calcium 8.9 - 10.3 mg/dL - 9.1 9.0  Total Protein 6.0 - 8.5 g/dL 6.8 6.9 7.0  Total Bilirubin 0.3 - 1.2 mg/dL - 1.8(H) 0.37  Alkaline Phos 38 - 126 U/L - 37(L) 39(L)  AST 15 - 41 U/L - 93(H) 45(H)  ALT 14 - 54 U/L - 32 22    Lab Results  Component Value Date   WBC 3.6 (L) 09/18/2017   HGB 13.4 09/18/2017   HCT 40.5 09/18/2017   MCV 99.5 09/18/2017   PLT 115 (L) 09/18/2017   NEUTROABS 1.8 09/18/2017    ASSESSMENT & PLAN:  Hemochromatosis, 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 I reviewed her blood work which showed that she had a ferritin of 243. Iron saturation was 99%.  Treatment: Phlebotomy weekly 4 started 09/11/2016-10/03/16, Now she is getting phlebotomy every 6 months most recent phlebotomy was in August 2018 Patient reports that after phlebotomy, she feels significantly better symptomatically.  Lab work review:  Ferritin 07/27/2017: 63 CBC 09/18/17: Hb 13.4  Goalto keep ferritin below 100 and iron saturation below 50% Phlebotomy necessary to prevent symptoms of hemochromatosis. This will be her maintenance regimen (every 6 months) Even though 2 months ago, her ferritin is decent, I believe that she would have a significant increase in iron saturation if phlebotomy is not done periodically. It is for this reason I am recommending phlebotomy today  Chronic mild leukopenia and mild thrombocytopenia: Relatively stable.  Return to clinic in 6 months for another phlebotomy.  We will check iron studies and ferritin along with celiac antibody panel 1  week before her appointment in 6 months. I will see her back in 6 months for follow-up    I spent 25 minutes talking to the patient of which more than half was spent in counseling and coordination of care.  Orders Placed This Encounter  Procedures  . Celiac panel 10    Standing Status:   Future    Standing Expiration Date:   11/01/2018  . Iron and TIBC    Standing Status:   Future    Standing Expiration Date:   09/27/2018  . Ferritin    Standing Status:   Future    Standing Expiration Date:   09/27/2018  . CBC with Differential (Cancer Center Only)    Standing Status:   Future    Standing Expiration Date:   09/27/2018   The patient has a good understanding of the overall plan. she agrees with it. she will call with any problems that may develop before the next visit here.   Tamsen MeekViinay K Alesi Zachery, MD 09/27/17

## 2017-09-27 NOTE — Telephone Encounter (Signed)
Gave patient AVs and calendar of upcoming August appointments.  °

## 2017-09-27 NOTE — Assessment & Plan Note (Signed)
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 I reviewed her blood work which showed that she had a ferritin of 243. Iron saturation was 99%.  Treatment: Phlebotomy weekly 4 started 09/11/2016-10/03/16, Now she is getting phlebotomy every 6 months most recent phlebotomy was in August 2018  Lab work review:  Ferritin 07/27/2017: 63 Goalto keep ferritin below 100 and iron saturation below 50%  Chronic mild leukopenia and mild thrombocytopenia Return to clinic in 6 months for another phlebotomy. I will see her back in 1 year.

## 2017-09-27 NOTE — Progress Notes (Signed)
Per DR Pamelia HoitGudena, OK to do phlebotomy today for maintenance.

## 2017-12-20 ENCOUNTER — Telehealth: Payer: Self-pay | Admitting: Neurology

## 2017-12-20 ENCOUNTER — Ambulatory Visit: Payer: BLUE CROSS/BLUE SHIELD | Admitting: Neurology

## 2017-12-20 ENCOUNTER — Encounter: Payer: Self-pay | Admitting: Neurology

## 2017-12-20 NOTE — Telephone Encounter (Signed)
This patient did not show for a revisit appointment today. 

## 2018-03-21 ENCOUNTER — Inpatient Hospital Stay: Payer: BLUE CROSS/BLUE SHIELD | Attending: Hematology and Oncology

## 2018-03-28 ENCOUNTER — Inpatient Hospital Stay: Payer: BLUE CROSS/BLUE SHIELD | Admitting: Hematology and Oncology

## 2018-03-28 ENCOUNTER — Inpatient Hospital Stay: Payer: BLUE CROSS/BLUE SHIELD

## 2018-03-28 NOTE — Assessment & Plan Note (Deleted)
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 I reviewed her blood work which showed that she had a ferritin of 243. Iron saturation was 99%.  Treatment: Phlebotomy weekly 4 started 09/11/2016-10/03/16,Now she is getting phlebotomy every 6 months most recent phlebotomy was in August 2018 Patient reports that after phlebotomy, she feels significantly better symptomatically.  Lab work review: Ferritin 07/27/2017: 63 CBC 09/18/17: Hb 13.4  Goalto keep ferritin below 100 and iron saturation below 50% Phlebotomy necessary to prevent symptoms of hemochromatosis. This will be her maintenance regimen (every 6 months)  Chronic mild leukopenia and mild thrombocytopenia: Relatively stable.  Return to clinic in 6 months for another phlebotomy.  We will check iron studies and ferritin 1 week before her appointment in 6 months.

## 2018-05-12 ENCOUNTER — Encounter (HOSPITAL_COMMUNITY): Payer: Self-pay

## 2018-05-12 ENCOUNTER — Emergency Department (HOSPITAL_COMMUNITY)
Admission: EM | Admit: 2018-05-12 | Discharge: 2018-05-13 | Disposition: A | Discharge status: Home / Self Care | Payer: Self-pay | Attending: Emergency Medicine | Admitting: Emergency Medicine

## 2018-05-12 DIAGNOSIS — G40909 Epilepsy, unspecified, not intractable, without status epilepticus: Secondary | ICD-10-CM | POA: Insufficient documentation

## 2018-05-12 DIAGNOSIS — W19XXXA Unspecified fall, initial encounter: Secondary | ICD-10-CM

## 2018-05-12 DIAGNOSIS — Z79899 Other long term (current) drug therapy: Secondary | ICD-10-CM | POA: Insufficient documentation

## 2018-05-12 DIAGNOSIS — R569 Unspecified convulsions: Secondary | ICD-10-CM

## 2018-05-12 HISTORY — DX: Unspecified convulsions: R56.9

## 2018-05-12 LAB — BASIC METABOLIC PANEL
Anion gap: 11 (ref 5–15)
BUN: 9 mg/dL (ref 6–20)
CALCIUM: 9.2 mg/dL (ref 8.9–10.3)
CHLORIDE: 98 mmol/L (ref 98–111)
CO2: 25 mmol/L (ref 22–32)
CREATININE: 0.9 mg/dL (ref 0.44–1.00)
GFR calc non Af Amer: >60 mL/min (ref >60)
Glucose, Bld: 108 mg/dL — ABNORMAL HIGH (ref 70–99)
Potassium: 3.3 mmol/L — ABNORMAL LOW (ref 3.5–5.1)
SODIUM: 134 mmol/L — AB (ref 135–145)

## 2018-05-12 LAB — CBC
HCT: 35.7 % — ABNORMAL LOW (ref 36.0–46.0)
Hemoglobin: 12.1 g/dL (ref 12.0–15.0)
MCH: 34.5 pg — AB (ref 26.0–34.0)
MCHC: 33.9 g/dL (ref 30.0–36.0)
MCV: 101.7 fL — AB (ref 78.0–100.0)
PLATELETS: 140 10*3/uL — AB (ref 150–400)
RBC: 3.51 MIL/uL — AB (ref 3.87–5.11)
RDW: 13.2 % (ref 11.5–15.5)
WBC: 6.3 10*3/uL (ref 4.0–10.5)

## 2018-05-12 LAB — I-STAT BETA HCG BLOOD, ED (MC, WL, AP ONLY)

## 2018-05-12 NOTE — ED Triage Notes (Signed)
Onset 4pm pt had seizure from standing position, hit right eye on cement floor, lasted 1 minute, post ictal 30 minutes.  Had another seizure @ 8;30p from standing position, hitting back of head on rocks, lasted 1 minute, post ictal 45 minutes.  C/o right lateral upper leg pain.  Abrasions on right lateral ankle, right elbow.  Pt had seizure 2 years ago, seen neurologist, magnesium low, has been on magnesium since.   Pt reports had sip of beer today.

## 2018-05-13 ENCOUNTER — Emergency Department (HOSPITAL_COMMUNITY): Payer: Self-pay

## 2018-05-13 LAB — MAGNESIUM: MAGNESIUM: 1.5 mg/dL — AB (ref 1.7–2.4)

## 2018-05-13 LAB — CBG MONITORING, ED: Glucose-Capillary: 95 mg/dL (ref 70–99)

## 2018-05-13 LAB — PHOSPHORUS: Phosphorus: 3.2 mg/dL (ref 2.5–4.6)

## 2018-05-13 MED ORDER — CHLORDIAZEPOXIDE HCL 25 MG PO CAPS: ORAL_CAPSULE | ORAL | 0 refills | Status: AC

## 2018-05-13 MED ORDER — VITAMIN B-1 100 MG PO TABS: 100.0000 mg | ORAL_TABLET | Freq: Every day | ORAL | 0 refills | Status: AC

## 2018-05-13 MED ORDER — VITAMIN B-1 100 MG PO TABS
100.0000 mg | ORAL_TABLET | Freq: Once | ORAL | Status: AC
  Administered 2018-05-13: 100 mg via ORAL
  Filled 2018-05-13: qty 1

## 2018-05-13 MED ORDER — LEVETIRACETAM IN NACL 1000 MG/100ML IV SOLN
1000.0000 mg | Freq: Once | INTRAVENOUS | Status: AC
  Administered 2018-05-13: 1000 mg via INTRAVENOUS
  Filled 2018-05-13: qty 100

## 2018-05-13 MED ORDER — CHLORDIAZEPOXIDE HCL 25 MG PO CAPS
25.0000 mg | ORAL_CAPSULE | Freq: Once | ORAL | Status: AC
  Administered 2018-05-13: 25 mg via ORAL
  Filled 2018-05-13: qty 1

## 2018-05-13 MED ORDER — MAGNESIUM SULFATE 2 GM/50ML IV SOLN
2.0000 g | Freq: Once | INTRAVENOUS | Status: AC
  Administered 2018-05-13: 2 g via INTRAVENOUS
  Filled 2018-05-13: qty 50

## 2018-05-13 NOTE — ED Provider Notes (Signed)
[facility] EMERGENCY DEPARTMENT Provider Note  CSN: [id number] Arrival date & time: 05/12/18 2224  Chief Complaint(s) Seizures  HPI Samantha [name] is a 27 y.o. female   HPI Seizures   This is a recurrent problem. The current episode started 6 to 12 hours ago. The problem has been resolved. Number of times: 2 seizures; 3 hrs apart. The most recent episode lasted less than 30 seconds. Associated symptoms include muscle weakness (BLE (baseline for several months)). Pertinent negatives include no confusion, no headaches, no visual disturbance, no neck stiffness, no chest pain, no cough, no nausea, no vomiting and no diarrhea. Characteristics include rhythmic jerking, loss of consciousness and cyanosis. The episode was witnessed. There was no sensation of an aura present. The seizures did not continue in the ED. Possible causes include change in alcohol use (daily drinker; approx 1-2 12oz beers. did not drink yesterday. had a small sip today.). hypomagnesemia There has been no fever. There were no medications administered prior to arrival.   Back to baseline per family.  Past Medical History Past Medical History:  Diagnosis Date  . Anxiety   . Depression   . Environmental allergies   . Giardia   . Hemochromatosis   . Menorrhagia   . Seizures [facility])    Patient Active Problem List   Diagnosis Date Noted  . Numbness and tingling in both hands 04/24/2017  . Numbness and tingling of both legs below knees 04/24/2017  . Hemochromatosis, hereditary (HCC) 09/11/2016  . Seizures (HCC) 07/03/2016   Home Medication(s) Prior to Admission medications   Medication Sig Start Date End Date Taking? Authorizing Provider  Cyanocobalamin (VITAMIN B-12 PO) Take 1 tablet by mouth daily.   Yes [provider]  folic acid (FOLVITE) 1 MG tablet Take 1 mg by mouth daily.   Yes [provider]  MAGNESIUM PO Take 1 tablet by mouth daily.   Yes [provider]    Potassium 95 MG TABS Take 1 tablet by mouth daily.   Yes [provider]  chlordiazePOXIDE (LIBRIUM) 25 MG capsule 50mg  PO TID x 1D, then 25-50mg  PO BID X 1D, then 25-50mg  PO QD X 1D 05/13/18   Cardama, Amadeo Garnet, MD  metoCLOPramide (REGLAN) 10 MG tablet Take 1 tablet (10 mg total) by mouth every 6 (six) hours. Patient not taking: Reported on 05/13/2018 12/19/16   Earley Favor, NP  ondansetron (ZOFRAN) 4 MG tablet Take 1 tablet (4 mg total) by mouth every 6 (six) hours. Patient not taking: Reported on 05/13/2018 02/07/15   Phillis Haggis, MD  thiamine (VITAMIN B-1) 100 MG tablet Take 1 tablet (100 mg total) by mouth daily. 05/13/18 06/12/18  Nira Conn, MD                                                                                                                                    Past Surgical History Past Surgical  History:  Procedure Laterality Date  . COSMETIC SURGERY     Removal of birthmark on face.  Marland Kitchen FOOT SURGERY Left    Family History Family History  Problem Relation Age of Onset  . Anxiety disorder Mother   . Depression Mother   . Asthma Father   . Psoriasis Father   . Hyperlipidemia Father   . Gout Father     Social History Social History   Tobacco Use  . Smoking status: Never Smoker  . Smokeless tobacco: Never Used  Substance Use Topics  . Alcohol use: Yes    Comment: daily (beer or two a day)  . Drug use: Yes    Types: Marijuana    Comment: Occasional use.   Allergies Sulfa antibiotics  Review of Systems Review of Systems All other systems are reviewed and are negative for acute change except as noted in the HPI  Physical Exam Vital Signs  I have reviewed the triage vital signs BP (!) 129/91   Pulse (!) 59   Temp 97.8 F (36.6 C) (Oral)   Resp 14   LMP 05/12/2017 Comment: Last perior 1 year ago   SpO2 98%   Physical Exam  Constitutional: She is oriented to person, place, and time. She appears well-developed and  well-nourished. No distress.  HENT:  Head: Normocephalic. Head is with contusion.    Right Ear: External ear normal.  Left Ear: External ear normal.  Nose: Nose normal.  Eyes: Pupils are equal, round, and reactive to light. Conjunctivae and EOM are normal. Right eye exhibits no discharge. Left eye exhibits no discharge. No scleral icterus.  Neck: Normal range of motion. Neck supple.  Cardiovascular: Normal rate, regular rhythm and normal heart sounds. Exam reveals no gallop and no friction rub.  No murmur heard. Pulses:      Radial pulses are 2+ on the right side, and 2+ on the left side.       Dorsalis pedis pulses are 2+ on the right side, and 2+ on the left side.  Pulmonary/Chest: Effort normal and breath sounds normal. No stridor. No respiratory distress. She has no wheezes.  Abdominal: Soft. She exhibits no distension. There is no tenderness.  Musculoskeletal: She exhibits no edema.       Right elbow: She exhibits normal range of motion, no swelling and no deformity. Tenderness found.       Right hip: She exhibits tenderness. She exhibits normal range of motion and no deformity.       Cervical back: She exhibits no bony tenderness.       Thoracic back: She exhibits no bony tenderness.       Lumbar back: She exhibits no bony tenderness.  Clavicles stable. Chest stable to AP/Lat compression. Pelvis stable to Lat compression. No obvious extremity deformity. No chest or abdominal wall contusion.  Neurological: She is alert and oriented to person, place, and time.  Moving all extremities  Skin: Skin is warm and dry. No rash noted. She is not diaphoretic. No erythema.  Psychiatric: She has a normal mood and affect.    ED Results and Treatments Labs (all labs ordered are listed, but only abnormal results are displayed) Labs Reviewed  BASIC METABOLIC PANEL - Abnormal; Notable for the following components:      Result Value   Sodium 134 (*)    Potassium 3.3 (*)    Glucose, Bld  108 (*)    All other components within normal limits  CBC - Abnormal; Notable for  the following components:   RBC 3.51 (*)    HCT 35.7 (*)    MCV 101.7 (*)    MCH 34.5 (*)    Platelets 140 (*)    All other components within normal limits  MAGNESIUM - Abnormal; Notable for the following components:   Magnesium 1.5 (*)    All other components within normal limits  PHOSPHORUS  CBG MONITORING, ED  I-STAT BETA HCG BLOOD, ED ([facility], [facility], [facility] ONLY)                                                                                                                         EKG  EKG Interpretation  Date/Time:  Monday May 13 2018 01:20:14 EDT Ventricular Rate:  64 PR Interval:    QRS Duration: 109 QT Interval:  440 QTC Calculation: 454 R Axis:   45 Text Interpretation:  Sinus rhythm Borderline low voltage, extremity leads Baseline wander in lead(s) V2 V3 No significant change since last tracing Confirmed by Drema Pry [phone]) on 05/13/2018 1:50:59 AM      Radiology Dg Elbow Complete Right (3+view)  Result Date: 05/13/2018 CLINICAL DATA:  Seizures.  Right elbow abrasion. EXAM: RIGHT ELBOW - COMPLETE 3+ VIEW COMPARISON:  None FINDINGS: IV tubing projects over the elbow. No elbow effusion, fracture, or acute findings. IMPRESSION: No significant abnormality identified. Electronically Signed   By: Gaylyn Rong M.D.   On: 05/13/2018 01:08   Ct Head Wo Contrast  Result Date: 05/13/2018 CLINICAL DATA:  Two episodes of witnessed seizure. Headache and neck pain EXAM: CT HEAD WITHOUT CONTRAST CT CERVICAL SPINE WITHOUT CONTRAST TECHNIQUE: Multidetector CT imaging of the head and cervical spine was performed following the standard protocol without intravenous contrast. Multiplanar CT image reconstructions of the cervical spine were also generated. COMPARISON:  Brain MRI 07/18/2017 FINDINGS: CT HEAD FINDINGS Brain: No evidence of acute infarction, hemorrhage, hydrocephalus, extra-axial collection or  mass lesion/mass effect. Vascular: Negative Skull: Right parietal scalp reticulation is scarring based on prior Sinuses/Orbits: Negative CT CERVICAL SPINE FINDINGS Alignment: No traumatic malalignment Skull base and vertebrae: Negative for fracture Soft tissues and spinal canal: No prevertebral fluid or swelling. No visible canal hematoma. Disc levels:  Negative IMPRESSION: Negative head and cervical spine CT. Electronically Signed   By: Marnee Spring M.D.   On: 05/13/2018 01:02   Ct Cervical Spine Wo Contrast  Result Date: 05/13/2018 CLINICAL DATA:  Two episodes of witnessed seizure. Headache and neck pain EXAM: CT HEAD WITHOUT CONTRAST CT CERVICAL SPINE WITHOUT CONTRAST TECHNIQUE: Multidetector CT imaging of the head and cervical spine was performed following the standard protocol without intravenous contrast. Multiplanar CT image reconstructions of the cervical spine were also generated. COMPARISON:  Brain MRI 07/18/2017 FINDINGS: CT HEAD FINDINGS Brain: No evidence of acute infarction, hemorrhage, hydrocephalus, extra-axial collection or mass lesion/mass effect. Vascular: Negative Skull: Right parietal scalp reticulation is scarring based on prior Sinuses/Orbits: Negative CT CERVICAL SPINE FINDINGS Alignment: No traumatic malalignment Skull base and vertebrae:  Negative for fracture Soft tissues and spinal canal: No prevertebral fluid or swelling. No visible canal hematoma. Disc levels:  Negative IMPRESSION: Negative head and cervical spine CT. Electronically Signed   By: Marnee Spring M.D.   On: 05/13/2018 01:02   Dg Hip Unilat W Or W/o Pelvis 2-3 Views Right  Result Date: 05/13/2018 CLINICAL DATA:  Seizure.  Upper leg pain. EXAM: DG HIP (WITH OR WITHOUT PELVIS) 2-3V RIGHT COMPARISON:  None. FINDINGS: Spurring of the right femoral head. No fracture, dislocation, or acute bony findings. IMPRESSION: Mildly asymmetric spurring of the right femoral head. Electronically Signed   By: Gaylyn Rong  M.D.   On: 05/13/2018 01:09   Pertinent labs & imaging results that were available during my care of the patient were reviewed by me and considered in my medical decision making (see chart for details).  Medications Ordered in ED Medications  levETIRAcetam (KEPPRA) IVPB 1000 mg/100 mL premix (0 mg Intravenous Stopped 05/13/18 0156)  magnesium sulfate IVPB 2 g 50 mL (0 g Intravenous Stopped 05/13/18 0303)  chlordiazePOXIDE (LIBRIUM) capsule 25 mg (25 mg Oral Given 05/13/18 0235)  thiamine (VITAMIN B-1) tablet 100 mg (100 mg Oral Given 05/13/18 0302)                                                                                                                                    Procedures Procedures  (including critical care time)  Medical Decision Making / ED Course I have reviewed the nursing notes for this encounter and the patient's prior records (if available in EHR or on provided paperwork).    Daily alcohol user here for 2 seizures.  History of hypomagnesemia resulting in previous seizure.  Given Keppra load.  Possible alcohol withdrawal seizures versus hypomagnesemia.  Screening labs did reveal hypomagnesemia at 1.5.  No significant hyponatremia.  Given IV mag.  Also given oral thiamine.  Patient sustained head trauma and other injuries due to the seizures.  CT head and cervical spine negative.  Plain films of the right elbow and hip without acute fracture dislocation.  Discussed need for alcohol cessation.  The patient appears appropriate for Librium taper.  Patient provided with outpatient resources and financial assistance information.  The patient appears reasonably screened and/or stabilized for discharge and I doubt any other medical condition or other [facility] requiring further screening, evaluation, or treatment in the ED at this time prior to discharge.  The patient is safe for discharge with strict return precautions.   Final Clinical Impression(s) / ED Diagnoses Final  diagnoses:  Fall  Seizure [facility])  Hypomagnesemia syndrome    Disposition: Discharge  Condition: Good  I have discussed the results, Dx and Tx plan with the patient who expressed understanding and agree(s) with the plan. Discharge instructions discussed at great length. The patient was given strict return precautions who verbalized understanding of the instructions. No further questions at time of discharge.  ED Discharge Orders         Ordered    chlordiazePOXIDE (LIBRIUM) 25 MG capsule     05/13/18 0225    thiamine (VITAMIN B-1) 100 MG tablet  Daily     05/13/18 0342           This chart was dictated using voice recognition software.  Despite best efforts to proofread,  errors can occur which can change the documentation meaning.   Nira Conn, MD 05/13/18 [phone]

## 2018-05-13 NOTE — Discharge Instructions (Addendum)
He has been given a Librium taper to help you with withdrawal symptoms from alcohol cessation.  You have also been provided with outpatient resources to help you quit drinking.  Continue taking your magnesium; a typical daily dose in a patient with normal renal function is 240 to 1000 mg.

## 2018-05-13 NOTE — ED Notes (Signed)
Pt. To CT via stretcher. 

## 2020-06-05 IMAGING — DX DG ELBOW COMPLETE 3+V*R*
4 series · 4 of 4 positions shown · non-contrast
Comparison: None

CLINICAL DATA: Seizures.  Right elbow abrasion.

EXAM:
RIGHT ELBOW - COMPLETE 3+ VIEW

[elbow ap]
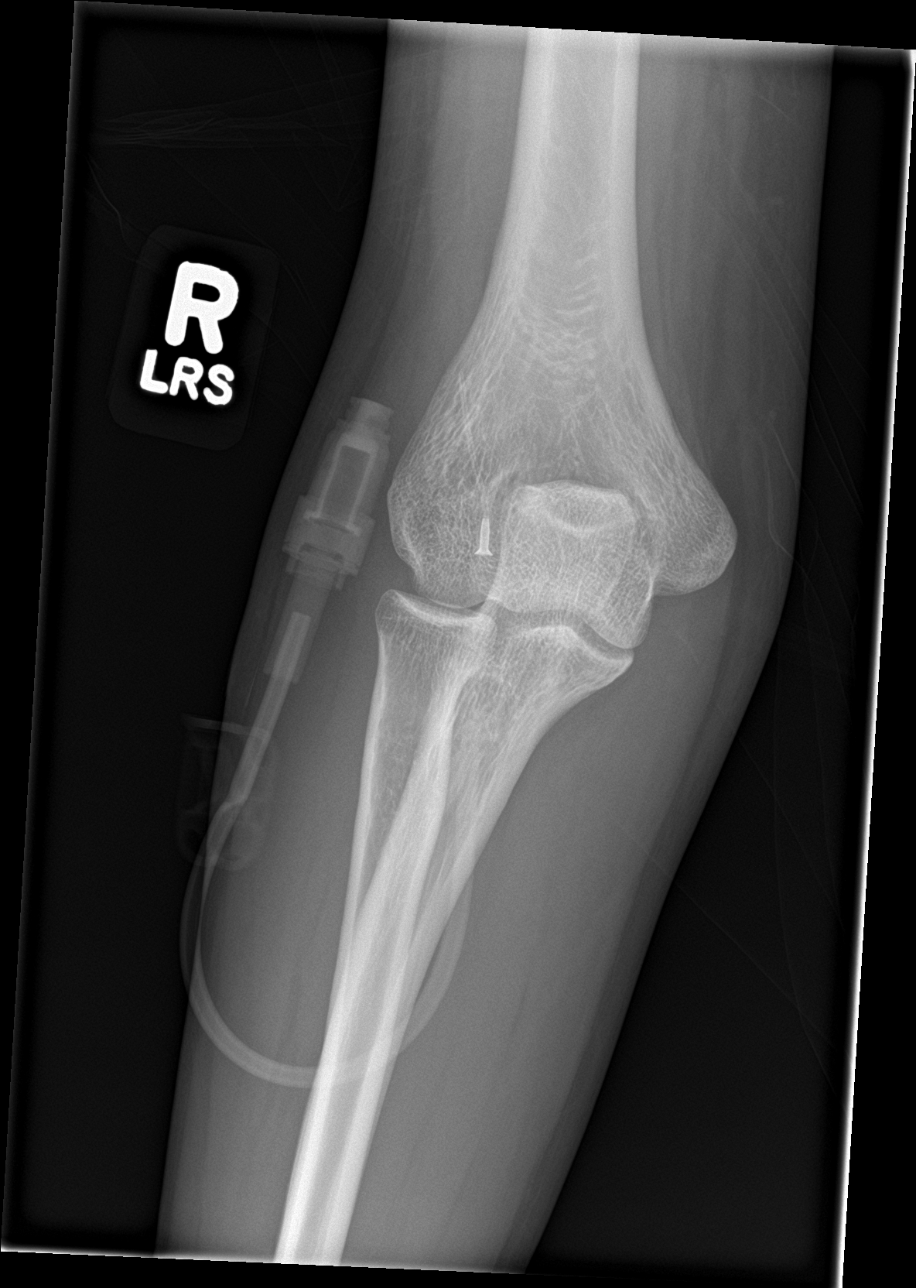

[elbow obl (1 of 2)]
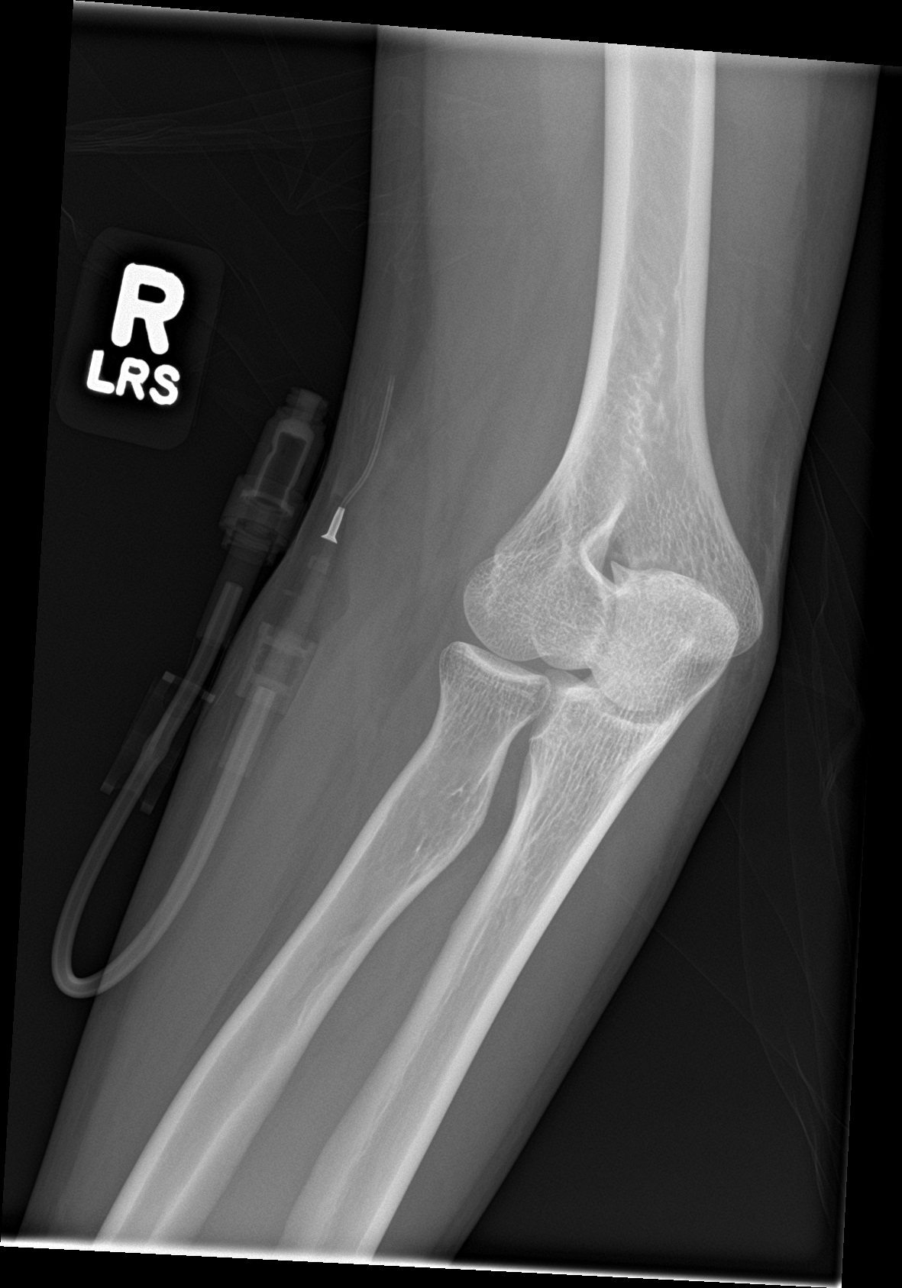

[elbow obl (2 of 2)]
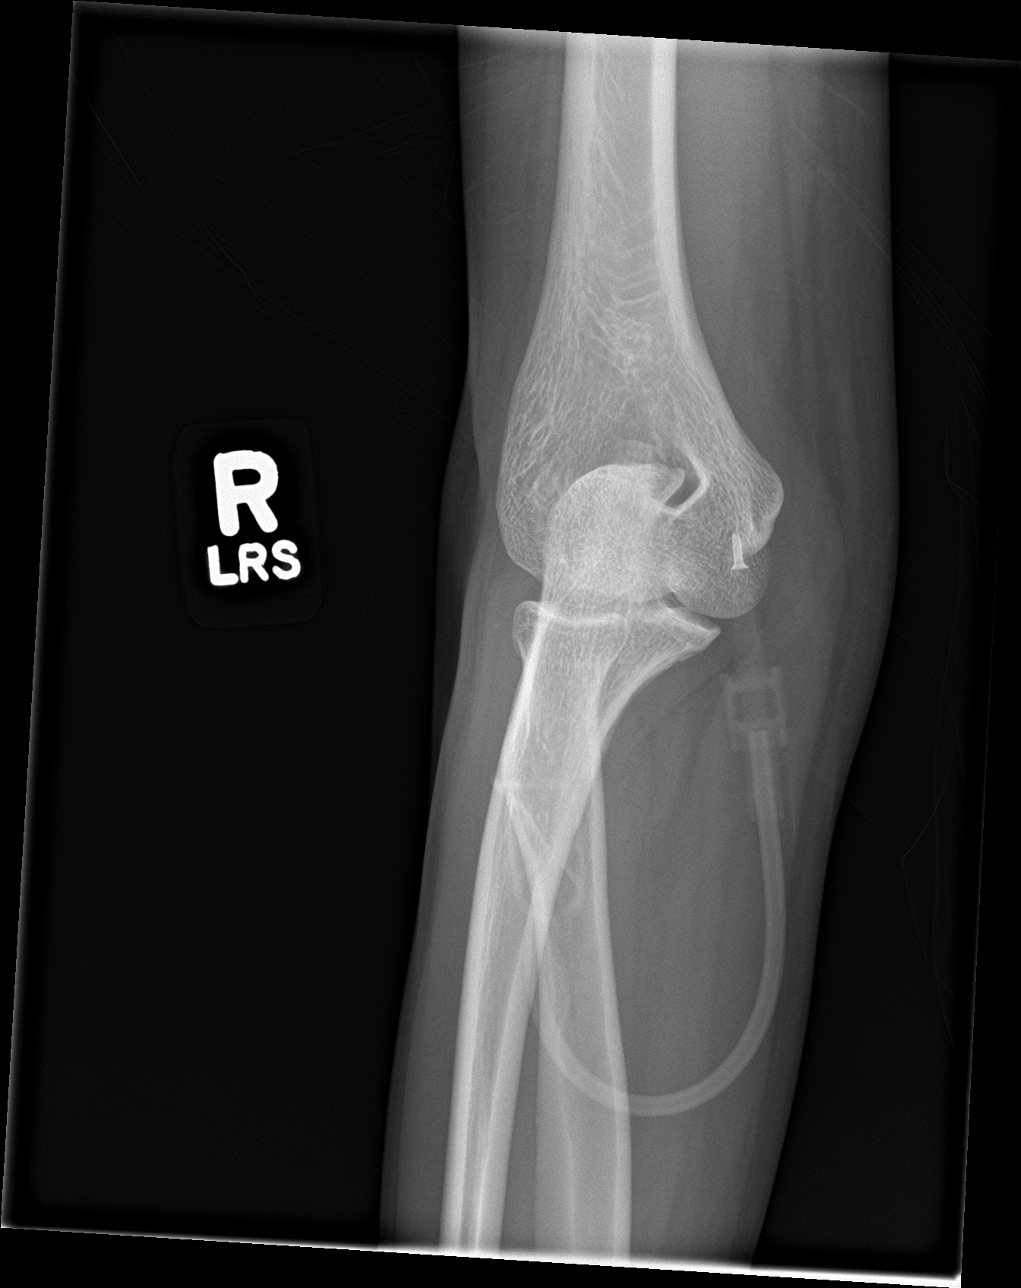

[elbow lat]
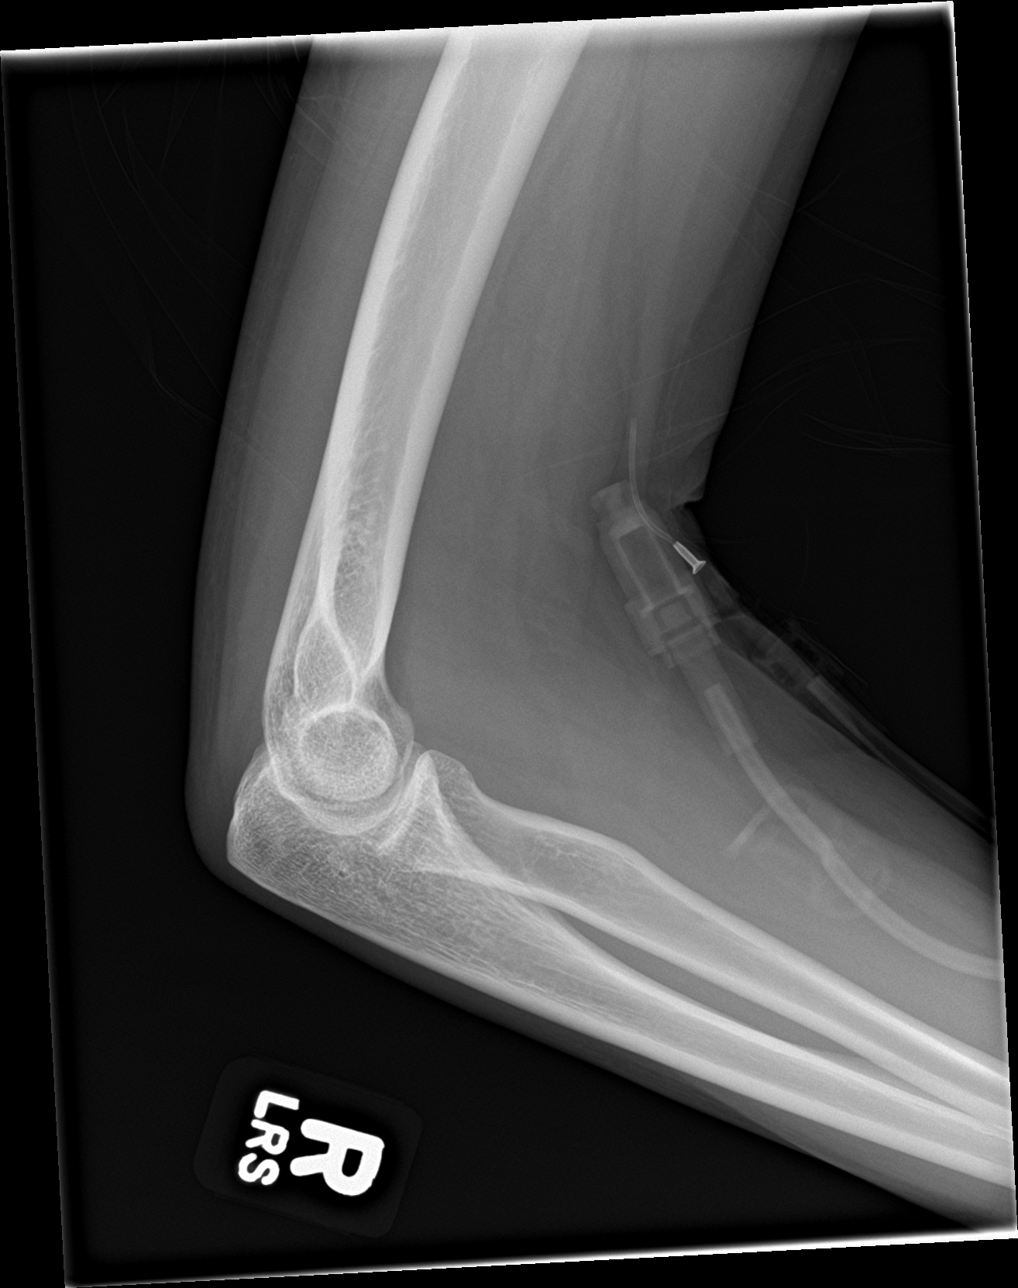

[4 of 4 positions shown; findings below may reference images not displayed]

FINDINGS: IV tubing projects over the elbow. No elbow effusion, fracture, or
acute findings.
IMPRESSION: No significant abnormality identified.
